# Patient Record
Sex: Male | Born: 1950 | Race: White | Hispanic: No | Marital: Married
Health system: Southern US, Community
[De-identification: ages and names within clinical notes are randomized; demographics above are authoritative.]

---

## 2010-11-24 HISTORY — PX: REPLACEMENT TOTAL KNEE: SUR1224

## 2021-01-22 DIAGNOSIS — C4362 Malignant melanoma of left upper limb, including shoulder: Secondary | ICD-10-CM | POA: Insufficient documentation

## 2021-01-25 DIAGNOSIS — E785 Hyperlipidemia, unspecified: Secondary | ICD-10-CM | POA: Insufficient documentation

## 2021-02-26 ENCOUNTER — Telehealth: Payer: Self-pay | Admitting: Oncology

## 2021-02-26 NOTE — Telephone Encounter (Signed)
A new patient appt has been scheduled for [name] to see Dr. Benay Spice on 4/12 at 3pm. Pt and his wife have made aware they are to go the [place] location. Aware to arrive 20 minutes early.

## 2021-03-04 ENCOUNTER — Encounter: Payer: Self-pay | Admitting: *Deleted

## 2021-03-04 ENCOUNTER — Telehealth: Payer: Self-pay | Admitting: *Deleted

## 2021-03-04 DIAGNOSIS — I10 Essential (primary) hypertension: Secondary | ICD-10-CM | POA: Insufficient documentation

## 2021-03-04 NOTE — Telephone Encounter (Signed)
Called patient and spoke w/him and wife. Updated his allergies, med list, pharmacy and problem list. Explained office location and to go to level 3 to park and office will be on that level. W/C is inside the door for use. His ambulation is limited due to "bone on bone" in right hip. Ortho (Dr. Mardelle Matte) defers timing of hip replacement to oncology. His PCP is in [place], a Dr. Caprice Renshaw. Patient is retired from [employer]. They are currently staying with their son, Steven Morrow in [place]. Their ususal routine is to go to [place] during the summer months. Wife will bring CD with recent CT scan to visit.

## 2021-03-05 ENCOUNTER — Ambulatory Visit
Admission: RE | Admit: 2021-03-05 | Discharge: 2021-03-05 | Disposition: A | Discharge status: Home / Self Care | Payer: Self-pay | Source: Ambulatory Visit | Attending: Oncology | Admitting: Oncology

## 2021-03-05 ENCOUNTER — Inpatient Hospital Stay: Payer: 59 | Attending: Oncology | Admitting: Oncology

## 2021-03-05 ENCOUNTER — Other Ambulatory Visit: Payer: Self-pay

## 2021-03-05 VITALS — BP 160/90 | HR 83 | Temp 98.0°F | Resp 18 | Ht 66.0 in | Wt 196.8 lb

## 2021-03-05 DIAGNOSIS — M1611 Unilateral primary osteoarthritis, right hip: Secondary | ICD-10-CM | POA: Insufficient documentation

## 2021-03-05 DIAGNOSIS — E785 Hyperlipidemia, unspecified: Secondary | ICD-10-CM | POA: Insufficient documentation

## 2021-03-05 DIAGNOSIS — C4362 Malignant melanoma of left upper limb, including shoulder: Secondary | ICD-10-CM | POA: Insufficient documentation

## 2021-03-05 DIAGNOSIS — Z85828 Personal history of other malignant neoplasm of skin: Secondary | ICD-10-CM | POA: Insufficient documentation

## 2021-03-05 DIAGNOSIS — I10 Essential (primary) hypertension: Secondary | ICD-10-CM | POA: Insufficient documentation

## 2021-03-05 DIAGNOSIS — Z8619 Personal history of other infectious and parasitic diseases: Secondary | ICD-10-CM | POA: Insufficient documentation

## 2021-03-05 NOTE — Progress Notes (Signed)
[facility] New Patient Consult   Requesting [id number] Lorren Splawn 70 y.o.  Jan 17, 1951    Reason for Consult: Melanoma   HPI: Mr. Honor Junes reports trauma to the left forearm approximately 3 months ago.  The area continued to bleed and did not heal with a "blister "formation.  He saw his dermatologist and a biopsy was obtained.  This confirmed a 2.9 mm Breslow depth malignant melanoma with ulceration and a mitotic rate of 2.  No evidence of satellite or in-transit metastases.  The tumor was staged as a pT3b,cN0cN0 melanoma. He was referred to Dr. Carlton Adam and was taken to a radical excision and sentinel lymph node mapping/biopsy on 01/31/2021.  A melanoma, lentigo maligna growth pattern was completely excised.  The Breslow depth returned at 4.24 mm, Clark level V, 6 mitoses per millimeter squared, ulceration was absent.  A total of 6 sentinel lymph nodes were obtained.  1 contained a 5 mm metastasis without extracapsular extension.  The pathologic stage was pT4apN1a.  A full-thickness skin graft from the left axilla was applied to the excision site.  CTs of the chest, abdomen, and pelvis on 01/31/2021 revealed no evidence of metastatic disease.  He is referred to consider the indication for adjuvant systemic therapy.  Past medical history:  1.  Hypertension 2.  Hyperlipidemia 3.  History of basal cell carcinomas at the scalp and right upper back 4.  Osteoarthritis of the right hip followed by Dr. Mardelle Matte 5.  History of hepatitis B  Past surgical history: 1.  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 Codeine Rash  . Glucosamine Forte [Nutritional Supplements] Rash  . Penicillins Rash    Family history: His father had colon cancer" skin "cancer  Social History:   He lives in [place], but temporarily is living with his son in [place].  He lives in [place] during the winter.  He  is retired from a Engineer, production occupation.  He does not use cigarettes.  He has 2 liquor drinks per day.  No transfusion history.  He had hepatitis in 1969 after eating clams in [place].  No risk factor for HIV.  He has received the COVID-19 vaccine.  ROS:   Positives include: Right hip pain, acute diarrhea 02/11/2021 with syncope and a right forehead laceration requiring sutures-emergency room evaluation, rash after taking glucosamine  A complete ROS was otherwise negative.  Physical Exam:  Blood pressure (!) 160/90, pulse 83, temperature 98 F (36.7 C), temperature source Tympanic, resp. rate 18, height '5\' 6"'  (1.676 m), weight 196 lb 12.8 oz (89.3 kg), SpO2 100 %.  HEENT: Neck without mass Lungs: Clear bilaterally Cardiac: Regular rate and rhythm Abdomen: No hepatosplenomegaly, no mass, nontender GU: Testes without mass Vascular: No leg edema Lymph nodes: No cervical, supraclavicular, axillary, or inguinal nodes Neurologic: Alert and oriented, the motor exam appears intact in the upper and lower extremities bilaterally Skin: Multiple benign-appearing moles over the trunk.  Healing skin graft site at the left lower arm, healed incision in the left axilla Musculoskeletal: No spine tenderness    Imaging: As per HPI   Assessment/Plan:   1. Stage IIIC melanoma (pT4apN1a), status post a radical excision of the left forearm melanoma, left axillary sentinel lymph node mapping/biopsy, and full thickness skin graft at the resection site 01/31/2021  Biopsy of left arm melanoma 12/28/2020-clinical stage IIb (cT3bcN0cM0), ulceration present, mitotic rate of 2/mm2  CTs  chest, abdomen, and pelvis 01/31/2021-no evidence of metastatic disease 2. History of hepatitis B 3. Hypertension 4. Right hip osteoarthritis 5.   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 Honor Junes has been diagnosed with malignant melanoma of the left forearm.  He underwent a radical excision,  sentinel lymph node mapping/biopsy, and full-thickness skin graft for a stage IIIc tumor.  We reviewed the pathologic staging, prognosis, and adjuvant treatment options.  He has a significant chance of developing recurrent melanoma over the next few years.  I recommend adjuvant systemic therapy.  We discussed immunotherapy and treatment with BRAF/MEK inhibitors.  We do not have a result for BRAF mutation testing in his tumor.  We will request BRAF mutation testing.  Mr. Honor Junes will return for an office visit and further discussion in 2 weeks.  He will be out of town next week.  We will discuss options based on the BRAF result.  He is symptomatic with severe pain at the right hip related osteoarthritis.  He plans to proceed with a right hip replacement by Dr. Mardelle Matte.  He should be able to undergo the hip replacement while receiving adjuvant systemic therapy.  Betsy Coder, MD  03/05/2021, 5:31 PM

## 2021-03-06 ENCOUNTER — Telehealth: Payer: Self-pay | Admitting: *Deleted

## 2021-03-06 NOTE — Telephone Encounter (Signed)
Per Dr. Benay Spice: Email to [facility] requesting BRAF testing on surgical cae #[id number] dated 01/31/21 from [facility]. Phone [phone]. Left VM for patient to provide copy of the dermatologist biopsy on 12/28/20 if they have it or contact office with name and phone of dermatologist doing the procedure.

## 2021-03-11 ENCOUNTER — Inpatient Hospital Stay
Admission: RE | Admit: 2021-03-11 | Discharge: 2021-03-11 | Disposition: A | Discharge status: Home / Self Care | Payer: Self-pay | Source: Ambulatory Visit | Attending: Oncology | Admitting: Oncology

## 2021-03-11 ENCOUNTER — Other Ambulatory Visit (HOSPITAL_COMMUNITY): Payer: Self-pay | Admitting: Oncology

## 2021-03-11 DIAGNOSIS — C801 Malignant (primary) neoplasm, unspecified: Secondary | ICD-10-CM

## 2021-03-20 ENCOUNTER — Other Ambulatory Visit: Payer: Self-pay

## 2021-03-20 ENCOUNTER — Encounter: Payer: Self-pay | Admitting: Oncology

## 2021-03-20 ENCOUNTER — Inpatient Hospital Stay: Payer: 59 | Admitting: Oncology

## 2021-03-20 ENCOUNTER — Inpatient Hospital Stay: Payer: 59

## 2021-03-20 ENCOUNTER — Telehealth: Payer: Self-pay

## 2021-03-20 VITALS — BP 173/71 | HR 67 | Temp 97.7°F | Resp 16 | Wt 193.8 lb

## 2021-03-20 DIAGNOSIS — Z8619 Personal history of other infectious and parasitic diseases: Secondary | ICD-10-CM | POA: Diagnosis not present

## 2021-03-20 DIAGNOSIS — C4362 Malignant melanoma of left upper limb, including shoulder: Secondary | ICD-10-CM

## 2021-03-20 DIAGNOSIS — I10 Essential (primary) hypertension: Secondary | ICD-10-CM | POA: Diagnosis not present

## 2021-03-20 DIAGNOSIS — Z85828 Personal history of other malignant neoplasm of skin: Secondary | ICD-10-CM | POA: Diagnosis not present

## 2021-03-20 DIAGNOSIS — E785 Hyperlipidemia, unspecified: Secondary | ICD-10-CM | POA: Diagnosis not present

## 2021-03-20 DIAGNOSIS — M1611 Unilateral primary osteoarthritis, right hip: Secondary | ICD-10-CM | POA: Diagnosis not present

## 2021-03-20 LAB — CMP (CANCER CENTER ONLY)
ALT: 26 U/L (ref 0–44)
AST: 23 U/L (ref 15–41)
Albumin: 4.5 g/dL (ref 3.5–5.0)
Alkaline Phosphatase: 86 U/L (ref 38–126)
Anion gap: 9 (ref 5–15)
BUN: 20 mg/dL (ref 8–23)
CO2: 26 mmol/L (ref 22–32)
Calcium: 9.8 mg/dL (ref 8.9–10.3)
Chloride: 105 mmol/L (ref 98–111)
Creatinine: 1.1 mg/dL (ref 0.61–1.24)
GFR, Estimated: >60 mL/min (ref >60)
Glucose, Bld: 97 mg/dL (ref 70–99)
Potassium: 4.3 mmol/L (ref 3.5–5.1)
Sodium: 140 mmol/L (ref 135–145)
Total Bilirubin: 0.8 mg/dL (ref 0.3–1.2)
Total Protein: 7.2 g/dL (ref 6.5–8.1)

## 2021-03-20 LAB — CBC WITH DIFFERENTIAL (CANCER CENTER ONLY)
Abs Immature Granulocytes: 0.03 10*3/uL (ref 0.00–0.07)
Basophils Absolute: 0 10*3/uL (ref 0.0–0.1)
Basophils Relative: 1 %
Eosinophils Absolute: 0.1 10*3/uL (ref 0.0–0.5)
Eosinophils Relative: 2 %
HCT: 43.2 % (ref 39.0–52.0)
Hemoglobin: 14.8 g/dL (ref 13.0–17.0)
Immature Granulocytes: 1 %
Lymphocytes Relative: 23 %
Lymphs Abs: 1.4 10*3/uL (ref 0.7–4.0)
MCH: 33.6 pg (ref 26.0–34.0)
MCHC: 34.3 g/dL (ref 30.0–36.0)
MCV: 98.2 fL (ref 80.0–100.0)
Monocytes Absolute: 0.5 10*3/uL (ref 0.1–1.0)
Monocytes Relative: 9 %
Neutro Abs: 3.8 10*3/uL (ref 1.7–7.7)
Neutrophils Relative %: 64 %
Platelet Count: 208 10*3/uL (ref 150–400)
RBC: 4.4 MIL/uL (ref 4.22–5.81)
RDW: 12.9 % (ref 11.5–15.5)
WBC Count: 5.9 10*3/uL (ref 4.0–10.5)
nRBC: 0 % (ref 0.0–0.2)

## 2021-03-20 LAB — TSH: TSH: 0.68 u[IU]/mL (ref 0.350–4.500)

## 2021-03-20 NOTE — Progress Notes (Addendum)
  [facility] OFFICE PROGRESS NOTE   Diagnosis: Melanoma  INTERVAL HISTORY:   [name] returns as scheduled.  He continues to have pain at the right hip.  He plans to have hip replacement surgery as soon as possible with Dr. Mardelle Matte.  The left arm incision is healing.  No new complaint.  Objective:  Vital signs in last 24 hours:  Blood pressure (!) 173/71, pulse 67, temperature 97.7 F (36.5 C), temperature source Oral, resp. rate 16, weight 193 lb 12.8 oz (87.9 kg), SpO2 100 %.    Lymphatics: No cervical, supraclavicular, or axillary nodes Resp: Lungs clear bilaterally Cardio: Regular rate and rhythm GI: No hepatosplenomegaly Vascular: No leg edema Skin: Healed incision at the left forearm with a remaining scab.  Healed left axillary incision  Portacath/PICC-without erythema  Lab Results:  No results found for: WBC, HGB, HCT, MCV, PLT, NEUTROABS  CMP  No results found for: NA, K, CL, CO2, GLUCOSE, BUN, CREATININE, CALCIUM, PROT, ALBUMIN, AST, ALT, ALKPHOS, BILITOT, GFRNONAA, GFRAA  No results found for: CEA1  No results found for: INR  Imaging:  No results found.  Medications: I have reviewed the patient's current medications.   Assessment/Plan: 1. Stage IIIC melanoma (pT4apN1a), status post a radical excision of the left forearm melanoma, left axillary sentinel lymph node mapping/biopsy, and full thickness skin graft at the resection site 01/31/2021  Biopsy of left arm melanoma 12/28/2020-clinical stage IIb (cT3bcN0cM0), ulceration present, mitotic rate of 2/mm2  CTs chest, abdomen, and pelvis 01/31/2021-no evidence of metastatic disease  01/31/2021- melanoma reexcision: Breslow depth 4.24 mm, Clark level V, 6 mitoses per millimeter squared, ulceration absent,pT4a, 1/6 lymph nodes with a 5 mm deposit of metastatic melanoma,pN1a, BRAF mutation not detected 2. History of hepatitis B 3. Hypertension 4. Right hip osteoarthritis 5.   Left total knee  replacement August 2012     Disposition:  Betsy Coder, MD  03/20/2021  12:29 PM   [name] has been diagnosed with stage IIIc melanoma of the left forearm.  He has recovered from surgery.  We do not have results from BRAF testing.  I discussed adjuvant treatment options again today with Mr. O'Connell.  We discussed targeted therapy with a BRAF/MEK regimen versus immunotherapy.  We reviewed the expected follow-up schedule and toxicities with these regimens.  He prefers pembrolizumab given on a 3-week schedule with the possibility of switching to a 6-week schedule in the future.  We reviewed potential toxicities associated with pembrolizumab including the chance of allergic reaction, rash, and diarrhea.  We discussed other autoimmune toxicities.  He understands these toxicities could be long-lasting or permanent.  He will return to the lab for a baseline CBC, chemistry panel, and TSH today.  He will be scheduled for cycle 1 pembrolizumab on 03/27/2021.  He will return for an office visit and cycle 2 on 04/17/2021.

## 2021-03-20 NOTE — Progress Notes (Signed)
START ON PATHWAY REGIMEN - Melanoma and Other Skin Cancers     A cycle is every 21 days:     Pembrolizumab   **Always confirm dose/schedule in your pharmacy ordering system**  Patient Characteristics: Melanoma, Cutaneous/Unknown Primary, Postoperative without Neoadjuvant Therapy (Pathologic Staging), Any pT, pN+, BRAF V600 Wild Type / BRAF V600 Results Pending or Unknown Disease Classification: Melanoma Disease Subtype: Cutaneous BRAF V600 Mutation Status: Awaiting BRAF V600 Results Therapeutic Status: Postoperative without Neoadjuvant Therapy (Pathologic Staging) AJCC T Category: pT4a AJCC N Category: pN1a AJCC M Category: cM0 AJCC 8 Stage Grouping: IIIC Intent of Therapy: Curative Intent, Discussed with Patient

## 2021-03-20 NOTE — Telephone Encounter (Signed)
TC to Pt per Dr Benay Spice to inform Pt that B-RAF genetics test was negative. Spoke with Pt's wife and informed her test was negative and per Dr Benay Spice Immunotherapy is the way to go. Pt's wife verbalized understanding. No further problems or concerns noted.

## 2021-03-21 ENCOUNTER — Encounter: Payer: Self-pay | Admitting: Oncology

## 2021-03-24 ENCOUNTER — Other Ambulatory Visit: Payer: Self-pay | Admitting: Oncology

## 2021-03-27 ENCOUNTER — Other Ambulatory Visit: Payer: Self-pay

## 2021-03-27 ENCOUNTER — Inpatient Hospital Stay: Payer: Medicare (Managed Care) | Attending: Oncology

## 2021-03-27 ENCOUNTER — Inpatient Hospital Stay: Payer: Medicare (Managed Care)

## 2021-03-27 VITALS — BP 155/87 | HR 64 | Temp 98.3°F | Resp 18 | Ht 66.0 in | Wt 195.8 lb

## 2021-03-27 DIAGNOSIS — Z5112 Encounter for antineoplastic immunotherapy: Secondary | ICD-10-CM | POA: Insufficient documentation

## 2021-03-27 DIAGNOSIS — C4362 Malignant melanoma of left upper limb, including shoulder: Secondary | ICD-10-CM

## 2021-03-27 MED ORDER — PROCHLORPERAZINE MALEATE 10 MG PO TABS: 10.0000 mg | ORAL_TABLET | Freq: Four times a day (QID) | ORAL | 1 refills | Status: DC | PRN

## 2021-03-27 MED ORDER — SODIUM CHLORIDE 0.9 % IV SOLN
Freq: Once | INTRAVENOUS | Status: AC
  Filled 2021-03-27: qty 250

## 2021-03-27 MED ORDER — SODIUM CHLORIDE 0.9 % IV SOLN
200.0000 mg | Freq: Once | INTRAVENOUS | Status: AC
  Administered 2021-03-27: 200 mg via INTRAVENOUS
  Filled 2021-03-27: qty 8

## 2021-03-27 NOTE — Progress Notes (Signed)
Per Dr. Benay Spice: okay to use Labs from 03/20/21

## 2021-03-27 NOTE — Patient Instructions (Signed)
[facility]    Discharge Instructions:  Thank you for choosing [facility] to provide your oncology and hematology care.   If you have a lab appointment with the Cancer Center, please go directly to the Cancer Center and check in at the registration area.   Wear comfortable clothing and clothing appropriate for easy access to any Portacath or PICC line.   We strive to give you quality time with your provider. You may need to reschedule your appointment if you arrive late (15 or more minutes).  Arriving late affects you and other patients whose appointments are after yours.  Also, if you miss three or more appointments without notifying the office, you may be dismissed from the clinic at the provider's discretion.      For prescription refill requests, have your pharmacy contact our office and allow 72 hours for refills to be completed.    Today you received the following chemotherapy and/or immunotherapy agents Pembrolizumab (KEYTRUDA).   To help prevent nausea and vomiting after your treatment, we encourage you to take your nausea medication as directed.  BELOW ARE SYMPTOMS THAT SHOULD BE REPORTED IMMEDIATELY: . *FEVER GREATER THAN 100.4 F (38 C) OR HIGHER . *CHILLS OR SWEATING . *NAUSEA AND VOMITING THAT IS NOT CONTROLLED WITH YOUR NAUSEA MEDICATION . *UNUSUAL SHORTNESS OF BREATH . *UNUSUAL BRUISING OR BLEEDING . *URINARY PROBLEMS (pain or burning when urinating, or frequent urination) . *BOWEL PROBLEMS (unusual diarrhea, constipation, pain near the anus) . TENDERNESS IN MOUTH AND THROAT WITH OR WITHOUT PRESENCE OF ULCERS (sore throat, sores in mouth, or a toothache) . UNUSUAL RASH, SWELLING OR PAIN  . UNUSUAL VAGINAL DISCHARGE OR ITCHING   Items with * indicate a potential emergency and should be followed up as soon as possible or go to the Emergency Department if any problems should occur.  Please show the CHEMOTHERAPY ALERT CARD or  IMMUNOTHERAPY ALERT CARD at check-in to the Emergency Department and triage nurse.  Should you have questions after your visit or need to cancel or reschedule your appointment, please contact [facility]  Dept: [phone]  and follow the prompts.  Office hours are 8:00 a.m. to 4:30 p.m. Monday - Friday. Please note that voicemails left after 4:00 p.m. may not be returned until the following business day.  We are closed weekends and major holidays. You have access to a nurse at all times for urgent questions. Please call the main number to the clinic Dept: [phone] and follow the prompts.   For any non-urgent questions, you may also contact your provider using MyChart. We now offer e-Visits for anyone 18 and older to request care online for non-urgent symptoms. For details visit [email or web address].   Also download the MyChart app! Go to the app store, search "MyChart", open the app, select [facility], and log in with your MyChart username and password.  Due to Covid, a mask is required upon entering the hospital/clinic. If you do not have a mask, one will be given to you upon arrival. For doctor visits, patients may have 1 support person aged 18 or older with them. For treatment visits, patients cannot have anyone with them due to current Covid guidelines and our immunocompromised population.   Pembrolizumab injection What is this medicine? PEMBROLIZUMAB (pem broe liz ue mab) is a monoclonal antibody. It is used to treat certain types of cancer. This medicine may be used for other purposes; ask your health care provider   or pharmacist if you have questions. COMMON BRAND NAME(S): Keytruda What should I tell my health care provider before I take this medicine? They need to know if you have any of these conditions:  autoimmune diseases like Crohn's disease, ulcerative colitis, or lupus  have had or planning to have an allogeneic stem cell transplant (uses  someone else's stem cells)  history of organ transplant  history of chest radiation  nervous system problems like myasthenia gravis or Guillain-Barre syndrome  an unusual or allergic reaction to pembrolizumab, other medicines, foods, dyes, or preservatives  pregnant or trying to get pregnant  breast-feeding How should I use this medicine? This medicine is for infusion into a vein. It is given by a health care professional in a hospital or clinic setting. A special MedGuide will be given to you before each treatment. Be sure to read this information carefully each time. Talk to your pediatrician regarding the use of this medicine in children. While this drug may be prescribed for children as young as 6 months for selected conditions, precautions do apply. Overdosage: If you think you have taken too much of this medicine contact a poison control center or emergency room at once. NOTE: This medicine is only for you. Do not share this medicine with others. What if I miss a dose? It is important not to miss your dose. Call your doctor or health care professional if you are unable to keep an appointment. What may interact with this medicine? Interactions have not been studied. This list may not describe all possible interactions. Give your health care provider a list of all the medicines, herbs, non-prescription drugs, or dietary supplements you use. Also tell them if you smoke, drink alcohol, or use illegal drugs. Some items may interact with your medicine. What should I watch for while using this medicine? Your condition will be monitored carefully while you are receiving this medicine. You may need blood work done while you are taking this medicine. Do not become pregnant while taking this medicine or for 4 months after stopping it. Women should inform their doctor if they wish to become pregnant or think they might be pregnant. There is a potential for serious side effects to an unborn  child. Talk to your health care professional or pharmacist for more information. Do not breast-feed an infant while taking this medicine or for 4 months after the last dose. What side effects may I notice from receiving this medicine? Side effects that you should report to your doctor or health care professional as soon as possible:  allergic reactions like skin rash, itching or hives, swelling of the face, lips, or tongue  bloody or black, tarry  breathing problems  changes in vision  chest pain  chills  confusion  constipation  cough  diarrhea  dizziness or feeling faint or lightheaded  fast or irregular heartbeat  fever  flushing  joint pain  low blood counts - this medicine may decrease the number of white blood cells, red blood cells and platelets. You may be at increased risk for infections and bleeding.  muscle pain  muscle weakness  pain, tingling, numbness in the hands or feet  persistent headache  redness, blistering, peeling or loosening of the skin, including inside the mouth  signs and symptoms of high blood sugar such as dizziness; dry mouth; dry skin; fruity breath; nausea; stomach pain; increased hunger or thirst; increased urination  signs and symptoms of kidney injury like trouble passing urine or change in   the amount of urine  signs and symptoms of liver injury like dark urine, light-colored stools, loss of appetite, nausea, right upper belly pain, yellowing of the eyes or skin  sweating  swollen lymph nodes  weight loss Side effects that usually do not require medical attention (report to your doctor or health care professional if they continue or are bothersome):  decreased appetite  hair loss  tiredness This list may not describe all possible side effects. Call your doctor for medical advice about side effects. You may report side effects to FDA at 1-800-FDA-1088. Where should I keep my medicine? This drug is given in a hospital  or clinic and will not be stored at home. NOTE: This sheet is a summary. It may not cover all possible information. If you have questions about this medicine, talk to your doctor, pharmacist, or health care provider.  2021 Elsevier/Gold Standard (2019-10-12 21:44:53)  

## 2021-03-28 ENCOUNTER — Telehealth: Payer: Self-pay | Admitting: *Deleted

## 2021-03-28 NOTE — Telephone Encounter (Signed)
Patient called back to report no adverse effect from [facility]. Has developed some sinus congestion/sneezing and asking if OK to take Advil Sinus? Also asking for suggestion for OTC sleep aid. Informed him OK to take Advil Sinus. Can try OTC Benadryl, Tylenol PM or Melatonin. He has no desire to take Benadryl.

## 2021-03-28 NOTE — Telephone Encounter (Signed)
Attempted to reach patient re: status since 1st Keytruda on 5/04. Left VM requesting a return call.

## 2021-04-01 ENCOUNTER — Encounter: Payer: Self-pay | Admitting: Oncology

## 2021-04-01 ENCOUNTER — Other Ambulatory Visit: Payer: Self-pay | Admitting: Oncology

## 2021-04-03 ENCOUNTER — Encounter: Payer: Self-pay | Admitting: Oncology

## 2021-04-08 ENCOUNTER — Telehealth: Payer: Self-pay

## 2021-04-08 NOTE — Telephone Encounter (Signed)
Return call from Pt's wife stating Pt was having burning when he urinates. Spoke with Pt who stated that those symptoms have subsided once he increased his fluid intake. Informed Pt to continue fluids and if the symptoms should reoccur to give [place] a call. Pt verbalized understanding. No further problems or concerns noted.

## 2021-04-14 ENCOUNTER — Other Ambulatory Visit: Payer: Self-pay | Admitting: Oncology

## 2021-04-16 ENCOUNTER — Encounter: Payer: Self-pay | Admitting: Oncology

## 2021-04-17 ENCOUNTER — Inpatient Hospital Stay: Payer: Medicare (Managed Care) | Admitting: Oncology

## 2021-04-17 ENCOUNTER — Other Ambulatory Visit: Payer: Self-pay

## 2021-04-17 ENCOUNTER — Telehealth: Payer: Self-pay

## 2021-04-17 ENCOUNTER — Inpatient Hospital Stay: Payer: Medicare (Managed Care)

## 2021-04-17 VITALS — BP 136/81 | HR 72 | Temp 97.8°F | Resp 18 | Ht 66.0 in | Wt 192.0 lb

## 2021-04-17 DIAGNOSIS — Z5112 Encounter for antineoplastic immunotherapy: Secondary | ICD-10-CM | POA: Diagnosis not present

## 2021-04-17 DIAGNOSIS — C4362 Malignant melanoma of left upper limb, including shoulder: Secondary | ICD-10-CM

## 2021-04-17 MED ORDER — SODIUM CHLORIDE 0.9 % IV SOLN
Freq: Once | INTRAVENOUS | Status: AC
Start: 2021-04-17 — End: 2021-04-17
  Filled 2021-04-17: qty 250

## 2021-04-17 MED ORDER — SODIUM CHLORIDE 0.9 % IV SOLN
200.0000 mg | Freq: Once | INTRAVENOUS | Status: AC
  Administered 2021-04-17: 200 mg via INTRAVENOUS
  Filled 2021-04-17: qty 8

## 2021-04-17 NOTE — Telephone Encounter (Signed)
Pt had lab work done from outside lab. Scanned in today. ok to treat  With these results per Dr Benay Spice.

## 2021-04-17 NOTE — Progress Notes (Signed)
  [facility] OFFICE PROGRESS NOTE   Diagnosis: Melanoma  INTERVAL HISTORY:   [name] returns as scheduled.  He completed a first treatment with pembrolizumab on 03/27/2021.  No rash or diarrhea.  He reports transient burning with urination.  This has resolved.  He continues to have pain at the right hip and leg.  He is scheduled for hip replacement surgery on 05/02/2021.  He has noted a "bony "prominence in the left axilla.  Objective:  Vital signs in last 24 hours:  Blood pressure 136/81, pulse 72, temperature 97.8 F (36.6 C), resp. rate 18, height $RemoveBe'5\' 6"'[id number]$  (1.676 m), weight 192 lb (87.1 kg), SpO2 98 %.    Lymphatics: No axillary nodes, postoperative changes in the left axilla.  No mass. Resp: Lungs clear bilaterally Cardio: Regular rate and rhythm GI: No hepatosplenomegaly, no mass, nontender Vascular: No leg edema  Skin: Healed incision at the left forearm    Lab Results:  Lab Results  Component Value Date   WBC 5.9 03/20/2021   HGB 14.8 03/20/2021   HCT 43.2 03/20/2021   MCV 98.2 03/20/2021   PLT 208 03/20/2021   NEUTROABS 3.8 03/20/2021    CMP  Lab Results  Component Value Date   NA 140 03/20/2021   K 4.3 03/20/2021   CL 105 03/20/2021   CO2 26 03/20/2021   GLUCOSE 97 03/20/2021   BUN 20 03/20/2021   CREATININE 1.10 03/20/2021   CALCIUM 9.8 03/20/2021   PROT 7.2 03/20/2021   ALBUMIN 4.5 03/20/2021   AST 23 03/20/2021   ALT 26 03/20/2021   ALKPHOS 86 03/20/2021   BILITOT 0.8 03/20/2021   GFRNONAA >60 03/20/2021    Medications: I have reviewed the patient's current medications.   Assessment/Plan: 1. Stage IIIC melanoma (pT4apN1a), status post a radical excision of the left forearm melanoma, left axillary sentinel lymph node mapping/biopsy, and full thickness skin graft at the resection site 01/31/2021  Biopsy of left arm melanoma 12/28/2020-clinical stage IIb (cT3bcN0cM0), ulceration present, mitotic rate of 2/mm2  CTs chest, abdomen,  and pelvis 01/31/2021-no evidence of metastatic disease  01/31/2021- melanoma reexcision: Breslow depth 4.24 mm, Clark level V, 6 mitoses per millimeter squared, ulceration absent,pT4a, 1/6 lymph nodes with a 5 mm deposit of metastatic melanoma,pN1a, BRAF mutation not detected  Cycle 1 pembrolizumab 03/27/2021 2. History of hepatitis B 3. Hypertension 4. Right hip osteoarthritis 5.   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 appears stable.  He tolerated the first treatment with pembrolizumab well.  He will complete cycle 2 today.  He had labs at [facility] on 04/15/2021.  The chemistry panel was unremarkable.  He will return for an office visit in the next cycle of pembrolizumab in 3 weeks.  Betsy Coder, MD  04/17/2021  12:04 PM

## 2021-04-17 NOTE — Patient Instructions (Signed)
[facility]  Discharge Instructions: Thank you for choosing [facility] to provide your oncology and hematology care.   If you have a lab appointment with the [facility], please go directly to the [facility] and check in at the registration area.   Wear comfortable clothing and clothing appropriate for easy access to any Portacath or PICC line.   We strive to give you quality time with your provider. You may need to reschedule your appointment if you arrive late (15 or more minutes).  Arriving late affects you and other patients whose appointments are after yours.  Also, if you miss three or more appointments without notifying the office, you may be dismissed from the clinic at the provider's discretion.      For prescription refill requests, have your pharmacy contact our office and allow 72 hours for refills to be completed.    Today you received the following chemotherapy and/or immunotherapy agents Keytruda    To help prevent nausea and vomiting after your treatment, we encourage you to take your nausea medication as directed.  BELOW ARE SYMPTOMS THAT SHOULD BE REPORTED IMMEDIATELY: . *FEVER GREATER THAN 100.4 F (38 C) OR HIGHER . *CHILLS OR SWEATING . *NAUSEA AND VOMITING THAT IS NOT CONTROLLED WITH YOUR NAUSEA MEDICATION . *UNUSUAL SHORTNESS OF BREATH . *UNUSUAL BRUISING OR BLEEDING . *URINARY PROBLEMS (pain or burning when urinating, or frequent urination) . *BOWEL PROBLEMS (unusual diarrhea, constipation, pain near the anus) . TENDERNESS IN MOUTH AND THROAT WITH OR WITHOUT PRESENCE OF ULCERS (sore throat, sores in mouth, or a toothache) . UNUSUAL RASH, SWELLING OR PAIN  . UNUSUAL VAGINAL DISCHARGE OR ITCHING   Items with * indicate a potential emergency and should be followed up as soon as possible or go to the Emergency Department if any problems should occur.  Please show the CHEMOTHERAPY ALERT CARD or IMMUNOTHERAPY ALERT CARD  at check-in to the Emergency Department and triage nurse.  Should you have questions after your visit or need to cancel or reschedule your appointment, please contact [facility]  Dept: [phone]  and follow the prompts.  Office hours are 8:00 a.m. to 4:30 p.m. Monday - Friday. Please note that voicemails left after 4:00 p.m. may not be returned until the following business day.  We are closed weekends and major holidays. You have access to a nurse at all times for urgent questions. Please call the main number to the clinic Dept: [phone] and follow the prompts.   For any non-urgent questions, you may also contact your provider using MyChart. We now offer e-Visits for anyone 72 and older to request care online for non-urgent symptoms. For details visit [email or web address].   Also download the MyChart app! Go to the app store, search "MyChart", open the app, select [facility], and log in with your MyChart username and password.  Due to Covid, a mask is required upon entering the hospital/clinic. If you do not have a mask, one will be given to you upon arrival. For doctor visits, patients may have 1 support person aged 39 or older with them. For treatment visits, patients cannot have anyone with them due to current Covid guidelines and our immunocompromised population.   Pembrolizumab injection What is this medicine? PEMBROLIZUMAB (pem broe liz ue mab) is a monoclonal antibody. It is used to treat certain types of cancer. This medicine may be used for other purposes; ask your health care provider or pharmacist if  you have questions. COMMON BRAND NAME(S): Keytruda What should I tell my health care provider before I take this medicine? They need to know if you have any of these conditions:  autoimmune diseases like Crohn's disease, ulcerative colitis, or lupus  have had or planning to have an allogeneic stem cell transplant (uses someone else's stem  cells)  history of organ transplant  history of chest radiation  nervous system problems like myasthenia gravis or Guillain-Barre syndrome  an unusual or allergic reaction to pembrolizumab, other medicines, foods, dyes, or preservatives  pregnant or trying to get pregnant  breast-feeding How should I use this medicine? This medicine is for infusion into a vein. It is given by a health care professional in a hospital or clinic setting. A special MedGuide will be given to you before each treatment. Be sure to read this information carefully each time. Talk to your pediatrician regarding the use of this medicine in children. While this drug may be prescribed for children as young as 6 months for selected conditions, precautions do apply. Overdosage: If you think you have taken too much of this medicine contact a poison control center or emergency room at once. NOTE: This medicine is only for you. Do not share this medicine with others. What if I miss a dose? It is important not to miss your dose. Call your doctor or health care professional if you are unable to keep an appointment. What may interact with this medicine? Interactions have not been studied. This list may not describe all possible interactions. Give your health care provider a list of all the medicines, herbs, non-prescription drugs, or dietary supplements you use. Also tell them if you smoke, drink alcohol, or use illegal drugs. Some items may interact with your medicine. What should I watch for while using this medicine? Your condition will be monitored carefully while you are receiving this medicine. You may need blood work done while you are taking this medicine. Do not become pregnant while taking this medicine or for 4 months after stopping it. Women should inform their doctor if they wish to become pregnant or think they might be pregnant. There is a potential for serious side effects to an unborn child. Talk to your  health care professional or pharmacist for more information. Do not breast-feed an infant while taking this medicine or for 4 months after the last dose. What side effects may I notice from receiving this medicine? Side effects that you should report to your doctor or health care professional as soon as possible:  allergic reactions like skin rash, itching or hives, swelling of the face, lips, or tongue  bloody or black, tarry  breathing problems  changes in vision  chest pain  chills  confusion  constipation  cough  diarrhea  dizziness or feeling faint or lightheaded  fast or irregular heartbeat  fever  flushing  joint pain  low blood counts - this medicine may decrease the number of white blood cells, red blood cells and platelets. You may be at increased risk for infections and bleeding.  muscle pain  muscle weakness  pain, tingling, numbness in the hands or feet  persistent headache  redness, blistering, peeling or loosening of the skin, including inside the mouth  signs and symptoms of high blood sugar such as dizziness; dry mouth; dry skin; fruity breath; nausea; stomach pain; increased hunger or thirst; increased urination  signs and symptoms of kidney injury like trouble passing urine or change in the amount of  urine  signs and symptoms of liver injury like dark urine, light-colored stools, loss of appetite, nausea, right upper belly pain, yellowing of the eyes or skin  sweating  swollen lymph nodes  weight loss Side effects that usually do not require medical attention (report to your doctor or health care professional if they continue or are bothersome):  decreased appetite  hair loss  tiredness This list may not describe all possible side effects. Call your doctor for medical advice about side effects. You may report side effects to FDA at 1-800-FDA-1088. Where should I keep my medicine? This drug is given in a hospital or clinic and will  not be stored at home. NOTE: This sheet is a summary. It may not cover all possible information. If you have questions about this medicine, talk to your doctor, pharmacist, or health care provider.  2021 Elsevier/Gold Standard (2019-10-12 21:44:53)

## 2021-04-23 ENCOUNTER — Encounter: Payer: Self-pay | Admitting: Oncology

## 2021-04-27 ENCOUNTER — Encounter: Payer: Self-pay | Admitting: Oncology

## 2021-04-29 ENCOUNTER — Encounter: Payer: Self-pay | Admitting: Oncology

## 2021-05-05 ENCOUNTER — Other Ambulatory Visit: Payer: Self-pay | Admitting: Oncology

## 2021-05-08 ENCOUNTER — Inpatient Hospital Stay: Payer: Medicare (Managed Care) | Attending: Oncology

## 2021-05-08 ENCOUNTER — Inpatient Hospital Stay (HOSPITAL_BASED_OUTPATIENT_CLINIC_OR_DEPARTMENT_OTHER): Payer: Medicare (Managed Care) | Admitting: Oncology

## 2021-05-08 ENCOUNTER — Inpatient Hospital Stay: Payer: Medicare (Managed Care)

## 2021-05-08 ENCOUNTER — Other Ambulatory Visit: Payer: Self-pay

## 2021-05-08 ENCOUNTER — Encounter: Payer: Self-pay | Admitting: Oncology

## 2021-05-08 VITALS — BP 123/70 | HR 84 | Temp 97.8°F | Resp 18 | Ht 66.0 in | Wt 192.0 lb

## 2021-05-08 DIAGNOSIS — C4362 Malignant melanoma of left upper limb, including shoulder: Secondary | ICD-10-CM | POA: Insufficient documentation

## 2021-05-08 DIAGNOSIS — Z96641 Presence of right artificial hip joint: Secondary | ICD-10-CM | POA: Insufficient documentation

## 2021-05-08 DIAGNOSIS — Z79899 Other long term (current) drug therapy: Secondary | ICD-10-CM | POA: Diagnosis not present

## 2021-05-08 DIAGNOSIS — I10 Essential (primary) hypertension: Secondary | ICD-10-CM | POA: Diagnosis not present

## 2021-05-08 DIAGNOSIS — Z5112 Encounter for antineoplastic immunotherapy: Secondary | ICD-10-CM | POA: Diagnosis present

## 2021-05-08 LAB — CMP (CANCER CENTER ONLY)
ALT: 21 U/L (ref 0–44)
AST: 18 U/L (ref 15–41)
Albumin: 4.1 g/dL (ref 3.5–5.0)
Alkaline Phosphatase: 80 U/L (ref 38–126)
Anion gap: 11 (ref 5–15)
BUN: 31 mg/dL — ABNORMAL HIGH (ref 8–23)
CO2: 24 mmol/L (ref 22–32)
Calcium: 9.1 mg/dL (ref 8.9–10.3)
Chloride: 105 mmol/L (ref 98–111)
Creatinine: 1.08 mg/dL (ref 0.61–1.24)
GFR, Estimated: >60 mL/min (ref >60)
Glucose, Bld: 89 mg/dL (ref 70–99)
Potassium: 3.9 mmol/L (ref 3.5–5.1)
Sodium: 140 mmol/L (ref 135–145)
Total Bilirubin: 0.8 mg/dL (ref 0.3–1.2)
Total Protein: 7.2 g/dL (ref 6.5–8.1)

## 2021-05-08 MED ORDER — SODIUM CHLORIDE 0.9 % IV SOLN
Freq: Once | INTRAVENOUS | Status: AC
  Filled 2021-05-08: qty 250

## 2021-05-08 MED ORDER — SODIUM CHLORIDE 0.9 % IV SOLN
200.0000 mg | Freq: Once | INTRAVENOUS | Status: AC
  Administered 2021-05-08: 200 mg via INTRAVENOUS
  Filled 2021-05-08: qty 8

## 2021-05-08 NOTE — Progress Notes (Signed)
  [facility] OFFICE VISIT PROGRESS NOTE  I connected with Steven Morrow on 05/08/21 at 11:40 AM EDT by video enabled telemedicine visit and verified that I am speaking with the correct person using two identifiers.   I discussed the limitations, risks, security and privacy concerns of performing an evaluation and management service by telemedicine and the availability of in-person appointments. I also discussed with the patient that there may be a patient responsible charge related to this service. The patient expressed understanding and agreed to proceed.  Other persons participating in the visit and their role in the encounter: Wife  Patient's location: Office Provider's location: Home    Diagnosis: Melanoma  INTERVAL HISTORY:  [name] completed another cycle of pembrolizumab on 04/17/2021.  He reports tolerating the treatment well.  No rash or diarrhea.  He underwent right hip replacement surgery on 05/02/2021.  He had pain following surgery.  He is no longer taking narcotic analgesics.  He is participating in physical therapy.  He continues to have discomfort at the surgical site.  Objective:  Vital signs in last 24 hours:  Blood pressure 123/70, pulse 84, temperature 97.8 F (36.6 C), temperature source Oral, resp. rate 18, height _0  (1.676 m), weight 192 lb (87.1 kg), SpO2 100 %.     Lab Results:  Lab Results  Component Value Date   WBC 5.9 03/20/2021   HGB 14.8 03/20/2021   HCT 43.2 03/20/2021   MCV 98.2 03/20/2021   PLT 208 03/20/2021   NEUTROABS 3.8 03/20/2021    Medications: I have reviewed the patient's current medications.  Assessment/Plan: Stage IIIC melanoma (pT4apN1a), status post a radical excision of the left forearm melanoma, left axillary sentinel lymph node mapping/biopsy, and full thickness skin graft at the resection site 01/31/2021 Biopsy of left arm melanoma 12/28/2020-clinical stage IIb  ([id number]), ulceration present, mitotic rate of 2/mm2 CTs chest, abdomen, and pelvis 01/31/2021-no evidence of metastatic disease 01/31/2021- melanoma reexcision: Breslow depth 4.24 mm, Clark level V, 6 mitoses per millimeter squared, ulceration absent,pT4a, 1/6 lymph nodes with a 5 mm deposit of metastatic melanoma,pN1a, BRAF mutation not detected Cycle 1 pembrolizumab 03/27/2021 Cycle 2 pembrolizumab 04/17/2021 Cycle 3 pembrolizumab 05/08/2021 History of hepatitis B Hypertension Right hip osteoarthritis 5.   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   Right hip replacement surgery 05/02/2021   Disposition: [name] appears to be tolerating the pembrolizumab well.  He will complete another cycle today.  I reviewed today's chemistry panel.  He continues physical therapy following hip replacement surgery.  [name] will return for an office visit and pembrolizumab in 3 weeks.   I discussed the assessment and treatment plan with the patient. The patient was provided an opportunity to ask questions and all were answered. The patient agreed with the plan and demonstrated an understanding of the instructions.   The patient was advised to call back or seek an in-person evaluation if the symptoms worsen or if the condition fails to improve as anticipated.  I provided 20 minutes chart review, video, and documentation time during this encounter, and > 50% was spent counseling as documented under my assessment & plan.  Betsy Coder ANP/GNP-BC   05/08/2021 11:43 AM

## 2021-05-08 NOTE — Patient Instructions (Signed)
[facility]   Discharge Instructions: Thank you for choosing [facility] to provide your oncology and hematology care.   If you have a lab appointment with the Cancer Center, please go directly to the Cancer Center and check in at the registration area.   Wear comfortable clothing and clothing appropriate for easy access to any Portacath or PICC line.   We strive to give you quality time with your provider. You may need to reschedule your appointment if you arrive late (15 or more minutes).  Arriving late affects you and other patients whose appointments are after yours.  Also, if you miss three or more appointments without notifying the office, you may be dismissed from the clinic at the provider's discretion.      For prescription refill requests, have your pharmacy contact our office and allow 72 hours for refills to be completed.    Today you received the following chemotherapy and/or immunotherapy agents Pembrolizumab (KEYTRUDA).      To help prevent nausea and vomiting after your treatment, we encourage you to take your nausea medication as directed.  BELOW ARE SYMPTOMS THAT SHOULD BE REPORTED IMMEDIATELY: *FEVER GREATER THAN 100.4 F (38 C) OR HIGHER *CHILLS OR SWEATING *NAUSEA AND VOMITING THAT IS NOT CONTROLLED WITH YOUR NAUSEA MEDICATION *UNUSUAL SHORTNESS OF BREATH *UNUSUAL BRUISING OR BLEEDING *URINARY PROBLEMS (pain or burning when urinating, or frequent urination) *BOWEL PROBLEMS (unusual diarrhea, constipation, pain near the anus) TENDERNESS IN MOUTH AND THROAT WITH OR WITHOUT PRESENCE OF ULCERS (sore throat, sores in mouth, or a toothache) UNUSUAL RASH, SWELLING OR PAIN  UNUSUAL VAGINAL DISCHARGE OR ITCHING   Items with * indicate a potential emergency and should be followed up as soon as possible or go to the Emergency Department if any problems should occur.  Please show the CHEMOTHERAPY ALERT CARD or IMMUNOTHERAPY ALERT CARD  at check-in to the Emergency Department and triage nurse.  Should you have questions after your visit or need to cancel or reschedule your appointment, please contact [facility]  Dept: [phone]  and follow the prompts.  Office hours are 8:00 a.m. to 4:30 p.m. Monday - Friday. Please note that voicemails left after 4:00 p.m. may not be returned until the following business day.  We are closed weekends and major holidays. You have access to a nurse at all times for urgent questions. Please call the main number to the clinic Dept: [phone] and follow the prompts.   For any non-urgent questions, you may also contact your provider using MyChart. We now offer e-Visits for anyone 18 and older to request care online for non-urgent symptoms. For details visit [email or web address].   Also download the MyChart app! Go to the app store, search "MyChart", open the app, select [facility], and log in with your MyChart username and password.  Due to Covid, a mask is required upon entering the hospital/clinic. If you do not have a mask, one will be given to you upon arrival. For doctor visits, patients may have 1 support person aged 18 or older with them. For treatment visits, patients cannot have anyone with them due to current Covid guidelines and our immunocompromised population.   Pembrolizumab injection What is this medication? PEMBROLIZUMAB (pem broe liz ue mab) is a monoclonal antibody. It is used totreat certain types of cancer. This medicine may be used for other purposes; ask your health care provider orpharmacist if you have questions. COMMON BRAND NAME(S): Keytruda What   should I tell my care team before I take this medication? They need to know if you have any of these conditions: autoimmune diseases like Crohn's disease, ulcerative colitis, or lupus have had or planning to have an allogeneic stem cell transplant (uses someone else's stem cells) history of organ  transplant history of chest radiation nervous system problems like myasthenia gravis or Guillain-Barre syndrome an unusual or allergic reaction to pembrolizumab, other medicines, foods, dyes, or preservatives pregnant or trying to get pregnant breast-feeding How should I use this medication? This medicine is for infusion into a vein. It is given by a health careprofessional in a hospital or clinic setting. A special MedGuide will be given to you before each treatment. Be sure to readthis information carefully each time. Talk to your pediatrician regarding the use of this medicine in children. While this drug may be prescribed for children as young as 6 months for selectedconditions, precautions do apply. Overdosage: If you think you have taken too much of this medicine contact apoison control center or emergency room at once. NOTE: This medicine is only for you. Do not share this medicine with others. What if I miss a dose? It is important not to miss your dose. Call your doctor or health careprofessional if you are unable to keep an appointment. What may interact with this medication? Interactions have not been studied. This list may not describe all possible interactions. Give your health care provider a list of all the medicines, herbs, non-prescription drugs, or dietary supplements you use. Also tell them if you smoke, drink alcohol, or use illegaldrugs. Some items may interact with your medicine. What should I watch for while using this medication? Your condition will be monitored carefully while you are receiving thismedicine. You may need blood work done while you are taking this medicine. Do not become pregnant while taking this medicine or for 4 months after stopping it. Women should inform their doctor if they wish to become pregnant or think they might be pregnant. There is a potential for serious side effects to an unborn child. Talk to your health care professional or pharmacist for  more information. Do not breast-feed an infant while taking this medicine orfor 4 months after the last dose. What side effects may I notice from receiving this medication? Side effects that you should report to your doctor or health care professionalas soon as possible: allergic reactions like skin rash, itching or hives, swelling of the face, lips, or tongue bloody or black, tarry breathing problems changes in vision chest pain chills confusion constipation cough diarrhea dizziness or feeling faint or lightheaded fast or irregular heartbeat fever flushing joint pain low blood counts - this medicine may decrease the number of white blood cells, red blood cells and platelets. You may be at increased risk for infections and bleeding. muscle pain muscle weakness pain, tingling, numbness in the hands or feet persistent headache redness, blistering, peeling or loosening of the skin, including inside the mouth signs and symptoms of high blood sugar such as dizziness; dry mouth; dry skin; fruity breath; nausea; stomach pain; increased hunger or thirst; increased urination signs and symptoms of kidney injury like trouble passing urine or change in the amount of urine signs and symptoms of liver injury like dark urine, light-colored stools, loss of appetite, nausea, right upper belly pain, yellowing of the eyes or skin sweating swollen lymph nodes weight loss Side effects that usually do not require medical attention (report to yourdoctor or health care professional if   they continue or are bothersome): decreased appetite hair loss tiredness This list may not describe all possible side effects. Call your doctor for medical advice about side effects. You may report side effects to FDA at1-800-FDA-1088. Where should I keep my medication? This drug is given in a hospital or clinic and will not be stored at home. NOTE: This sheet is a summary. It may not cover all possible information. If you  have questions about this medicine, talk to your doctor, pharmacist, orhealth care provider.  2022 Elsevier/Gold Standard (2019-10-12 21:44:53)  

## 2021-05-27 ENCOUNTER — Other Ambulatory Visit: Payer: Self-pay | Admitting: Oncology

## 2021-05-29 ENCOUNTER — Other Ambulatory Visit: Payer: 59

## 2021-05-29 ENCOUNTER — Encounter: Payer: Self-pay | Admitting: Nurse Practitioner

## 2021-05-29 ENCOUNTER — Inpatient Hospital Stay: Payer: Medicare (Managed Care)

## 2021-05-29 ENCOUNTER — Other Ambulatory Visit: Payer: Self-pay

## 2021-05-29 ENCOUNTER — Inpatient Hospital Stay: Payer: Medicare (Managed Care) | Admitting: Nurse Practitioner

## 2021-05-29 ENCOUNTER — Inpatient Hospital Stay: Payer: Medicare (Managed Care) | Attending: Oncology

## 2021-05-29 VITALS — BP 121/77 | HR 76 | Temp 97.8°F | Resp 20 | Ht 66.0 in | Wt 193.6 lb

## 2021-05-29 DIAGNOSIS — Z79899 Other long term (current) drug therapy: Secondary | ICD-10-CM | POA: Insufficient documentation

## 2021-05-29 DIAGNOSIS — C4362 Malignant melanoma of left upper limb, including shoulder: Secondary | ICD-10-CM

## 2021-05-29 DIAGNOSIS — Z5112 Encounter for antineoplastic immunotherapy: Secondary | ICD-10-CM | POA: Insufficient documentation

## 2021-05-29 DIAGNOSIS — I10 Essential (primary) hypertension: Secondary | ICD-10-CM | POA: Insufficient documentation

## 2021-05-29 LAB — CBC WITH DIFFERENTIAL (CANCER CENTER ONLY)
Abs Immature Granulocytes: 0.01 10*3/uL (ref 0.00–0.07)
Basophils Absolute: 0 10*3/uL (ref 0.0–0.1)
Basophils Relative: 1 %
Eosinophils Absolute: 0.4 10*3/uL (ref 0.0–0.5)
Eosinophils Relative: 7 %
HCT: 40.2 % (ref 39.0–52.0)
Hemoglobin: 13.6 g/dL (ref 13.0–17.0)
Immature Granulocytes: 0 %
Lymphocytes Relative: 34 %
Lymphs Abs: 1.9 10*3/uL (ref 0.7–4.0)
MCH: 32.9 pg (ref 26.0–34.0)
MCHC: 33.8 g/dL (ref 30.0–36.0)
MCV: 97.3 fL (ref 80.0–100.0)
Monocytes Absolute: 0.7 10*3/uL (ref 0.1–1.0)
Monocytes Relative: 13 %
Neutro Abs: 2.6 10*3/uL (ref 1.7–7.7)
Neutrophils Relative %: 45 %
Platelet Count: 292 10*3/uL (ref 150–400)
RBC: 4.13 MIL/uL — ABNORMAL LOW (ref 4.22–5.81)
RDW: 12.7 % (ref 11.5–15.5)
WBC Count: 5.6 10*3/uL (ref 4.0–10.5)
nRBC: 0 % (ref 0.0–0.2)

## 2021-05-29 LAB — CMP (CANCER CENTER ONLY)
ALT: 22 U/L (ref 0–44)
AST: 19 U/L (ref 15–41)
Albumin: 4.3 g/dL (ref 3.5–5.0)
Alkaline Phosphatase: 115 U/L (ref 38–126)
Anion gap: 9 (ref 5–15)
BUN: 22 mg/dL (ref 8–23)
CO2: 24 mmol/L (ref 22–32)
Calcium: 9.4 mg/dL (ref 8.9–10.3)
Chloride: 105 mmol/L (ref 98–111)
Creatinine: 1.22 mg/dL (ref 0.61–1.24)
GFR, Estimated: >60 mL/min (ref >60)
Glucose, Bld: 102 mg/dL — ABNORMAL HIGH (ref 70–99)
Potassium: 3.7 mmol/L (ref 3.5–5.1)
Sodium: 138 mmol/L (ref 135–145)
Total Bilirubin: 0.6 mg/dL (ref 0.3–1.2)
Total Protein: 7 g/dL (ref 6.5–8.1)

## 2021-05-29 LAB — TSH: TSH: 0.927 u[IU]/mL (ref 0.350–4.500)

## 2021-05-29 MED ORDER — SODIUM CHLORIDE 0.9 % IV SOLN
Freq: Once | INTRAVENOUS | Status: AC
  Filled 2021-05-29: qty 250

## 2021-05-29 MED ORDER — SODIUM CHLORIDE 0.9 % IV SOLN
200.0000 mg | Freq: Once | INTRAVENOUS | Status: AC
  Administered 2021-05-29: 200 mg via INTRAVENOUS
  Filled 2021-05-29: qty 8

## 2021-05-29 NOTE — Progress Notes (Signed)
  [facility] OFFICE PROGRESS NOTE   Diagnosis: Melanoma  INTERVAL HISTORY:   Mr. Honor Junes returns as scheduled.  He completed another cycle of Pembrolizumab 05/08/2021.  He had a recent rash in the right groin, resolved following application of a powder.  His incision has healed.  He has intermittent loose stools, unchanged from baseline bowel habits.  No nausea or vomiting.  Objective:  Vital signs in last 24 hours:  Blood pressure 121/77, pulse 76, temperature 97.8 F (36.6 C), temperature source Oral, resp. rate 20, height $RemoveBe'5\' 6"'[id number]$  (1.676 m), weight 193 lb 9.6 oz (87.8 kg), SpO2 100 %.    HEENT: No thrush or ulcers. Resp: Lungs clear bilaterally. Cardio: Regular rate and rhythm. GI: Abdomen soft and nontender.  No hepatomegaly. Vascular: No leg edema. Neuro: Alert and oriented. Skin: Healed surgical incision right hip.  Healed incision left forearm.   Lab Results:  Lab Results  Component Value Date   WBC 5.6 05/29/2021   HGB 13.6 05/29/2021   HCT 40.2 05/29/2021   MCV 97.3 05/29/2021   PLT 292 05/29/2021   NEUTROABS 2.6 05/29/2021    Imaging:  No results found.  Medications: I have reviewed the patient's current medications.  Assessment/Plan: Stage IIIC melanoma (pT4apN1a), status post a radical excision of the left forearm melanoma, left axillary sentinel lymph node mapping/biopsy, and full thickness skin graft at the resection site 01/31/2021 Biopsy of left arm melanoma 12/28/2020-clinical stage IIb (cT3bcN0cM0), ulceration present, mitotic rate of 2/mm2 CTs chest, abdomen, and pelvis 01/31/2021-no evidence of metastatic disease 01/31/2021- melanoma reexcision: Breslow depth 4.24 mm, Clark level V, 6 mitoses per millimeter squared, ulceration absent,pT4a, 1/6 lymph nodes with a 5 mm deposit of metastatic melanoma,pN1a, BRAF mutation not detected Cycle 1 pembrolizumab 03/27/2021 Cycle 2 pembrolizumab 04/17/2021 Cycle 3 pembrolizumab 05/08/2021 Cycle 4  Pembrolizumab 05/29/2021 History of hepatitis B Hypertension Right hip osteoarthritis 5.   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   Right hip replacement surgery 05/02/2021    Disposition: Mr. Honor Junes appears stable.  He is tolerating pembrolizumab well.  Plan to continue every 3 weeks.  We reviewed the CBC and chemistry panel from today.  Labs adequate to proceed with treatment.  TSH is pending.  He will return for lab, follow-up, Pembrolizumab in 3 weeks.    Galo Sayed ANP/GNP-BC   05/29/2021  10:14 AM

## 2021-05-29 NOTE — Patient Instructions (Signed)
[facility]   Discharge Instructions: Thank you for choosing [facility] to provide your oncology and hematology care.   If you have a lab appointment with the Cancer Center, please go directly to the Cancer Center and check in at the registration area.   Wear comfortable clothing and clothing appropriate for easy access to any Portacath or PICC line.   We strive to give you quality time with your provider. You may need to reschedule your appointment if you arrive late (15 or more minutes).  Arriving late affects you and other patients whose appointments are after yours.  Also, if you miss three or more appointments without notifying the office, you may be dismissed from the clinic at the provider's discretion.      For prescription refill requests, have your pharmacy contact our office and allow 72 hours for refills to be completed.    Today you received the following chemotherapy and/or immunotherapy agents Pembrolizumab (KEYTRUDA).      To help prevent nausea and vomiting after your treatment, we encourage you to take your nausea medication as directed.  BELOW ARE SYMPTOMS THAT SHOULD BE REPORTED IMMEDIATELY: *FEVER GREATER THAN 100.4 F (38 C) OR HIGHER *CHILLS OR SWEATING *NAUSEA AND VOMITING THAT IS NOT CONTROLLED WITH YOUR NAUSEA MEDICATION *UNUSUAL SHORTNESS OF BREATH *UNUSUAL BRUISING OR BLEEDING *URINARY PROBLEMS (pain or burning when urinating, or frequent urination) *BOWEL PROBLEMS (unusual diarrhea, constipation, pain near the anus) TENDERNESS IN MOUTH AND THROAT WITH OR WITHOUT PRESENCE OF ULCERS (sore throat, sores in mouth, or a toothache) UNUSUAL RASH, SWELLING OR PAIN  UNUSUAL VAGINAL DISCHARGE OR ITCHING   Items with * indicate a potential emergency and should be followed up as soon as possible or go to the Emergency Department if any problems should occur.  Please show the CHEMOTHERAPY ALERT CARD or IMMUNOTHERAPY ALERT CARD  at check-in to the Emergency Department and triage nurse.  Should you have questions after your visit or need to cancel or reschedule your appointment, please contact [facility]  Dept: [phone]  and follow the prompts.  Office hours are 8:00 a.m. to 4:30 p.m. Monday - Friday. Please note that voicemails left after 4:00 p.m. may not be returned until the following business day.  We are closed weekends and major holidays. You have access to a nurse at all times for urgent questions. Please call the main number to the clinic Dept: [phone] and follow the prompts.   For any non-urgent questions, you may also contact your provider using MyChart. We now offer e-Visits for anyone 18 and older to request care online for non-urgent symptoms. For details visit [email or web address].   Also download the MyChart app! Go to the app store, search "MyChart", open the app, select [facility], and log in with your MyChart username and password.  Due to Covid, a mask is required upon entering the hospital/clinic. If you do not have a mask, one will be given to you upon arrival. For doctor visits, patients may have 1 support person aged 18 or older with them. For treatment visits, patients cannot have anyone with them due to current Covid guidelines and our immunocompromised population.   Pembrolizumab injection What is this medication? PEMBROLIZUMAB (pem broe liz ue mab) is a monoclonal antibody. It is used totreat certain types of cancer. This medicine may be used for other purposes; ask your health care provider orpharmacist if you have questions. COMMON BRAND NAME(S): Keytruda What   should I tell my care team before I take this medication? They need to know if you have any of these conditions: autoimmune diseases like Crohn's disease, ulcerative colitis, or lupus have had or planning to have an allogeneic stem cell transplant (uses someone else's stem cells) history of organ  transplant history of chest radiation nervous system problems like myasthenia gravis or Guillain-Barre syndrome an unusual or allergic reaction to pembrolizumab, other medicines, foods, dyes, or preservatives pregnant or trying to get pregnant breast-feeding How should I use this medication? This medicine is for infusion into a vein. It is given by a health careprofessional in a hospital or clinic setting. A special MedGuide will be given to you before each treatment. Be sure to readthis information carefully each time. Talk to your pediatrician regarding the use of this medicine in children. While this drug may be prescribed for children as young as 6 months for selectedconditions, precautions do apply. Overdosage: If you think you have taken too much of this medicine contact apoison control center or emergency room at once. NOTE: This medicine is only for you. Do not share this medicine with others. What if I miss a dose? It is important not to miss your dose. Call your doctor or health careprofessional if you are unable to keep an appointment. What may interact with this medication? Interactions have not been studied. This list may not describe all possible interactions. Give your health care provider a list of all the medicines, herbs, non-prescription drugs, or dietary supplements you use. Also tell them if you smoke, drink alcohol, or use illegaldrugs. Some items may interact with your medicine. What should I watch for while using this medication? Your condition will be monitored carefully while you are receiving thismedicine. You may need blood work done while you are taking this medicine. Do not become pregnant while taking this medicine or for 4 months after stopping it. Women should inform their doctor if they wish to become pregnant or think they might be pregnant. There is a potential for serious side effects to an unborn child. Talk to your health care professional or pharmacist for  more information. Do not breast-feed an infant while taking this medicine orfor 4 months after the last dose. What side effects may I notice from receiving this medication? Side effects that you should report to your doctor or health care professionalas soon as possible: allergic reactions like skin rash, itching or hives, swelling of the face, lips, or tongue bloody or black, tarry breathing problems changes in vision chest pain chills confusion constipation cough diarrhea dizziness or feeling faint or lightheaded fast or irregular heartbeat fever flushing joint pain low blood counts - this medicine may decrease the number of white blood cells, red blood cells and platelets. You may be at increased risk for infections and bleeding. muscle pain muscle weakness pain, tingling, numbness in the hands or feet persistent headache redness, blistering, peeling or loosening of the skin, including inside the mouth signs and symptoms of high blood sugar such as dizziness; dry mouth; dry skin; fruity breath; nausea; stomach pain; increased hunger or thirst; increased urination signs and symptoms of kidney injury like trouble passing urine or change in the amount of urine signs and symptoms of liver injury like dark urine, light-colored stools, loss of appetite, nausea, right upper belly pain, yellowing of the eyes or skin sweating swollen lymph nodes weight loss Side effects that usually do not require medical attention (report to yourdoctor or health care professional if   they continue or are bothersome): decreased appetite hair loss tiredness This list may not describe all possible side effects. Call your doctor for medical advice about side effects. You may report side effects to FDA at1-800-FDA-1088. Where should I keep my medication? This drug is given in a hospital or clinic and will not be stored at home. NOTE: This sheet is a summary. It may not cover all possible information. If you  have questions about this medicine, talk to your doctor, pharmacist, orhealth care provider.  2022 Elsevier/Gold Standard (2019-10-12 21:44:53)  

## 2021-05-30 ENCOUNTER — Encounter: Payer: Self-pay | Admitting: Oncology

## 2021-05-31 ENCOUNTER — Encounter: Payer: Self-pay | Admitting: Oncology

## 2021-06-03 ENCOUNTER — Encounter: Payer: Self-pay | Admitting: Oncology

## 2021-06-04 ENCOUNTER — Encounter: Payer: Self-pay | Admitting: Oncology

## 2021-06-15 ENCOUNTER — Other Ambulatory Visit: Payer: Self-pay | Admitting: Oncology

## 2021-06-17 ENCOUNTER — Inpatient Hospital Stay: Payer: Medicare (Managed Care)

## 2021-06-17 ENCOUNTER — Encounter: Payer: Self-pay | Admitting: Nurse Practitioner

## 2021-06-17 ENCOUNTER — Other Ambulatory Visit: Payer: Self-pay

## 2021-06-17 ENCOUNTER — Encounter: Payer: Self-pay | Admitting: Oncology

## 2021-06-17 ENCOUNTER — Inpatient Hospital Stay: Payer: Medicare (Managed Care) | Admitting: Oncology

## 2021-06-17 ENCOUNTER — Other Ambulatory Visit: Payer: Self-pay | Admitting: Oncology

## 2021-06-17 ENCOUNTER — Ambulatory Visit (HOSPITAL_COMMUNITY)
Admission: RE | Admit: 2021-06-17 | Discharge: 2021-06-17 | Disposition: A | Discharge status: Home / Self Care | Payer: Medicare Other | Source: Ambulatory Visit | Attending: Oncology | Admitting: Oncology

## 2021-06-17 VITALS — BP 116/70 | HR 65 | Temp 98.6°F | Resp 18 | Wt 197.2 lb

## 2021-06-17 DIAGNOSIS — C4362 Malignant melanoma of left upper limb, including shoulder: Secondary | ICD-10-CM | POA: Insufficient documentation

## 2021-06-17 DIAGNOSIS — Z5112 Encounter for antineoplastic immunotherapy: Secondary | ICD-10-CM | POA: Diagnosis not present

## 2021-06-17 LAB — CBC WITH DIFFERENTIAL (CANCER CENTER ONLY)
Abs Immature Granulocytes: 0.02 10*3/uL (ref 0.00–0.07)
Basophils Absolute: 0.1 10*3/uL (ref 0.0–0.1)
Basophils Relative: 1 %
Eosinophils Absolute: 0.6 10*3/uL — ABNORMAL HIGH (ref 0.0–0.5)
Eosinophils Relative: 10 %
HCT: 39.7 % (ref 39.0–52.0)
Hemoglobin: 13.3 g/dL (ref 13.0–17.0)
Immature Granulocytes: 0 %
Lymphocytes Relative: 28 %
Lymphs Abs: 1.8 10*3/uL (ref 0.7–4.0)
MCH: 32.6 pg (ref 26.0–34.0)
MCHC: 33.5 g/dL (ref 30.0–36.0)
MCV: 97.3 fL (ref 80.0–100.0)
Monocytes Absolute: 0.7 10*3/uL (ref 0.1–1.0)
Monocytes Relative: 11 %
Neutro Abs: 3.1 10*3/uL (ref 1.7–7.7)
Neutrophils Relative %: 50 %
Platelet Count: 248 10*3/uL (ref 150–400)
RBC: 4.08 MIL/uL — ABNORMAL LOW (ref 4.22–5.81)
RDW: 12.4 % (ref 11.5–15.5)
WBC Count: 6.2 10*3/uL (ref 4.0–10.5)
nRBC: 0 % (ref 0.0–0.2)

## 2021-06-17 LAB — CMP (CANCER CENTER ONLY)
ALT: 22 U/L (ref 0–44)
AST: 18 U/L (ref 15–41)
Albumin: 4.1 g/dL (ref 3.5–5.0)
Alkaline Phosphatase: 106 U/L (ref 38–126)
Anion gap: 9 (ref 5–15)
BUN: 19 mg/dL (ref 8–23)
CO2: 24 mmol/L (ref 22–32)
Calcium: 9 mg/dL (ref 8.9–10.3)
Chloride: 107 mmol/L (ref 98–111)
Creatinine: 1.02 mg/dL (ref 0.61–1.24)
GFR, Estimated: >60 mL/min (ref >60)
Glucose, Bld: 94 mg/dL (ref 70–99)
Potassium: 4.2 mmol/L (ref 3.5–5.1)
Sodium: 140 mmol/L (ref 135–145)
Total Bilirubin: 0.6 mg/dL (ref 0.3–1.2)
Total Protein: 6.7 g/dL (ref 6.5–8.1)

## 2021-06-17 MED ORDER — SODIUM CHLORIDE 0.9 % IV SOLN
Freq: Once | INTRAVENOUS | Status: AC
  Filled 2021-06-17: qty 250

## 2021-06-17 MED ORDER — SODIUM CHLORIDE 0.9 % IV SOLN
200.0000 mg | Freq: Once | INTRAVENOUS | Status: AC
  Administered 2021-06-17: 200 mg via INTRAVENOUS
  Filled 2021-06-17: qty 8

## 2021-06-17 NOTE — Progress Notes (Signed)
  [facility] OFFICE PROGRESS NOTE   Diagnosis: Melanoma  INTERVAL HISTORY:   [name] completed another treat with pembrolizumab on 05/29/2021.  No rash or diarrhea.  He has congestion in the right ear relieved with an over-the-counter cold preparation.  [name] has noted his left leg is enlarged.  He denies leg pain or erythema.  Objective:  Vital signs in last 24 hours:  Blood pressure 116/70, pulse 65, temperature 98.6 F (37 C), temperature source Oral, resp. rate 18, weight 197 lb 3.2 oz (89.4 kg), SpO2 99 %.    Resp: Lungs clear bilaterally Cardio: Regular rate and rhythm, distant heart sounds GI: No hepatosplenomegaly Vascular: The left lower leg is larger than the right side, no erythema or tenderness.  Skin: Healed left forearm and axillary incision   Lab Results:  Lab Results  Component Value Date   WBC 6.2 06/17/2021   HGB 13.3 06/17/2021   HCT 39.7 06/17/2021   MCV 97.3 06/17/2021   PLT 248 06/17/2021   NEUTROABS 3.1 06/17/2021    CMP  Lab Results  Component Value Date   NA 140 06/17/2021   K 4.2 06/17/2021   CL 107 06/17/2021   CO2 24 06/17/2021   GLUCOSE 94 06/17/2021   BUN 19 06/17/2021   CREATININE 1.02 06/17/2021   CALCIUM 9.0 06/17/2021   PROT 6.7 06/17/2021   ALBUMIN 4.1 06/17/2021   AST 18 06/17/2021   ALT 22 06/17/2021   ALKPHOS 106 06/17/2021   BILITOT 0.6 06/17/2021   GFRNONAA >60 06/17/2021     Medications: I have reviewed the patient's current medications.   Assessment/Plan: Stage IIIC melanoma (pT4apN1a), status post a radical excision of the left forearm melanoma, left axillary sentinel lymph node mapping/biopsy, and full thickness skin graft at the resection site 01/31/2021 Biopsy of left arm melanoma 12/28/2020-clinical stage IIb ([id number]), ulceration present, mitotic rate of 2/mm2 CTs chest, abdomen, and pelvis 01/31/2021-no evidence of metastatic disease 01/31/2021- melanoma reexcision: Breslow depth  4.24 mm, Clark level V, 6 mitoses per millimeter squared, ulceration absent,pT4a, 1/6 lymph nodes with a 5 mm deposit of metastatic melanoma,pN1a, BRAF mutation not detected Cycle 1 pembrolizumab 03/27/2021 Cycle 2 pembrolizumab 04/17/2021 Cycle 3 pembrolizumab 05/08/2021 Cycle 4 Pembrolizumab 05/29/2021 Cycle 5 pembrolizumab 06/17/2021 History of hepatitis B Hypertension Right hip osteoarthritis 5.   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   Right hip replacement surgery 05/02/2021    Disposition: [name] continues to tolerate the pembrolizumab well.  He will complete another cycle today.  He plans to relocate to [place] after the treatment here on 07/08/2021.  The plan is to change to every 6-week dosing beginning on 07/08/2021.  On We will check a Doppler of the left leg to rule out a DVT.  Betsy Coder, MD  06/17/2021  11:23 AM

## 2021-06-17 NOTE — Progress Notes (Signed)
Left lower extremity venous duplex completed. Refer to "CV Proc" under chart review to view preliminary results.  06/17/2021 3:28 PM Kelby Aline., MHA, RVT, RDCS, RDMS

## 2021-06-17 NOTE — Patient Instructions (Signed)
[facility]   Discharge Instructions: Thank you for choosing [facility] to provide your oncology and hematology care.   If you have a lab appointment with the Cancer Center, please go directly to the Cancer Center and check in at the registration area.   Wear comfortable clothing and clothing appropriate for easy access to any Portacath or PICC line.   We strive to give you quality time with your provider. You may need to reschedule your appointment if you arrive late (15 or more minutes).  Arriving late affects you and other patients whose appointments are after yours.  Also, if you miss three or more appointments without notifying the office, you may be dismissed from the clinic at the provider's discretion.      For prescription refill requests, have your pharmacy contact our office and allow 72 hours for refills to be completed.    Today you received the following chemotherapy and/or immunotherapy agents Pembrolizumab (KEYTRUDA).      To help prevent nausea and vomiting after your treatment, we encourage you to take your nausea medication as directed.  BELOW ARE SYMPTOMS THAT SHOULD BE REPORTED IMMEDIATELY: *FEVER GREATER THAN 100.4 F (38 C) OR HIGHER *CHILLS OR SWEATING *NAUSEA AND VOMITING THAT IS NOT CONTROLLED WITH YOUR NAUSEA MEDICATION *UNUSUAL SHORTNESS OF BREATH *UNUSUAL BRUISING OR BLEEDING *URINARY PROBLEMS (pain or burning when urinating, or frequent urination) *BOWEL PROBLEMS (unusual diarrhea, constipation, pain near the anus) TENDERNESS IN MOUTH AND THROAT WITH OR WITHOUT PRESENCE OF ULCERS (sore throat, sores in mouth, or a toothache) UNUSUAL RASH, SWELLING OR PAIN  UNUSUAL VAGINAL DISCHARGE OR ITCHING   Items with * indicate a potential emergency and should be followed up as soon as possible or go to the Emergency Department if any problems should occur.  Please show the CHEMOTHERAPY ALERT CARD or IMMUNOTHERAPY ALERT CARD  at check-in to the Emergency Department and triage nurse.  Should you have questions after your visit or need to cancel or reschedule your appointment, please contact [facility]  Dept: [phone]  and follow the prompts.  Office hours are 8:00 a.m. to 4:30 p.m. Monday - Friday. Please note that voicemails left after 4:00 p.m. may not be returned until the following business day.  We are closed weekends and major holidays. You have access to a nurse at all times for urgent questions. Please call the main number to the clinic Dept: [phone] and follow the prompts.   For any non-urgent questions, you may also contact your provider using MyChart. We now offer e-Visits for anyone 18 and older to request care online for non-urgent symptoms. For details visit [email or web address].   Also download the MyChart app! Go to the app store, search "MyChart", open the app, select [facility], and log in with your MyChart username and password.  Due to Covid, a mask is required upon entering the hospital/clinic. If you do not have a mask, one will be given to you upon arrival. For doctor visits, patients may have 1 support person aged 18 or older with them. For treatment visits, patients cannot have anyone with them due to current Covid guidelines and our immunocompromised population.   Pembrolizumab injection What is this medication? PEMBROLIZUMAB (pem broe liz ue mab) is a monoclonal antibody. It is used totreat certain types of cancer. This medicine may be used for other purposes; ask your health care provider orpharmacist if you have questions. COMMON BRAND NAME(S): Keytruda What   should I tell my care team before I take this medication? They need to know if you have any of these conditions: autoimmune diseases like Crohn's disease, ulcerative colitis, or lupus have had or planning to have an allogeneic stem cell transplant (uses someone else's stem cells) history of organ  transplant history of chest radiation nervous system problems like myasthenia gravis or Guillain-Barre syndrome an unusual or allergic reaction to pembrolizumab, other medicines, foods, dyes, or preservatives pregnant or trying to get pregnant breast-feeding How should I use this medication? This medicine is for infusion into a vein. It is given by a health careprofessional in a hospital or clinic setting. A special MedGuide will be given to you before each treatment. Be sure to readthis information carefully each time. Talk to your pediatrician regarding the use of this medicine in children. While this drug may be prescribed for children as young as 6 months for selectedconditions, precautions do apply. Overdosage: If you think you have taken too much of this medicine contact apoison control center or emergency room at once. NOTE: This medicine is only for you. Do not share this medicine with others. What if I miss a dose? It is important not to miss your dose. Call your doctor or health careprofessional if you are unable to keep an appointment. What may interact with this medication? Interactions have not been studied. This list may not describe all possible interactions. Give your health care provider a list of all the medicines, herbs, non-prescription drugs, or dietary supplements you use. Also tell them if you smoke, drink alcohol, or use illegaldrugs. Some items may interact with your medicine. What should I watch for while using this medication? Your condition will be monitored carefully while you are receiving thismedicine. You may need blood work done while you are taking this medicine. Do not become pregnant while taking this medicine or for 4 months after stopping it. Women should inform their doctor if they wish to become pregnant or think they might be pregnant. There is a potential for serious side effects to an unborn child. Talk to your health care professional or pharmacist for  more information. Do not breast-feed an infant while taking this medicine orfor 4 months after the last dose. What side effects may I notice from receiving this medication? Side effects that you should report to your doctor or health care professionalas soon as possible: allergic reactions like skin rash, itching or hives, swelling of the face, lips, or tongue bloody or black, tarry breathing problems changes in vision chest pain chills confusion constipation cough diarrhea dizziness or feeling faint or lightheaded fast or irregular heartbeat fever flushing joint pain low blood counts - this medicine may decrease the number of white blood cells, red blood cells and platelets. You may be at increased risk for infections and bleeding. muscle pain muscle weakness pain, tingling, numbness in the hands or feet persistent headache redness, blistering, peeling or loosening of the skin, including inside the mouth signs and symptoms of high blood sugar such as dizziness; dry mouth; dry skin; fruity breath; nausea; stomach pain; increased hunger or thirst; increased urination signs and symptoms of kidney injury like trouble passing urine or change in the amount of urine signs and symptoms of liver injury like dark urine, light-colored stools, loss of appetite, nausea, right upper belly pain, yellowing of the eyes or skin sweating swollen lymph nodes weight loss Side effects that usually do not require medical attention (report to yourdoctor or health care professional if   they continue or are bothersome): decreased appetite hair loss tiredness This list may not describe all possible side effects. Call your doctor for medical advice about side effects. You may report side effects to FDA at1-800-FDA-1088. Where should I keep my medication? This drug is given in a hospital or clinic and will not be stored at home. NOTE: This sheet is a summary. It may not cover all possible information. If you  have questions about this medicine, talk to your doctor, pharmacist, orhealth care provider.  2022 Elsevier/Gold Standard (2019-10-12 21:44:53)  

## 2021-06-18 ENCOUNTER — Encounter: Payer: Self-pay | Admitting: *Deleted

## 2021-06-18 NOTE — Progress Notes (Signed)
Patient will be in [place] for the remainder of summer. Faxed last office note, labs, chemo care plan, chemo flows heets and doppler report to Dr. Regan Lemming at [phone] as patient had requested.

## 2021-06-19 ENCOUNTER — Inpatient Hospital Stay: Payer: Medicare (Managed Care)

## 2021-06-19 ENCOUNTER — Inpatient Hospital Stay: Payer: Medicare (Managed Care) | Admitting: Oncology

## 2021-06-19 ENCOUNTER — Other Ambulatory Visit: Payer: 59

## 2021-06-20 ENCOUNTER — Encounter: Payer: Self-pay | Admitting: Oncology

## 2021-07-07 ENCOUNTER — Other Ambulatory Visit: Payer: Self-pay | Admitting: Oncology

## 2021-07-08 ENCOUNTER — Encounter: Payer: Self-pay | Admitting: *Deleted

## 2021-07-08 ENCOUNTER — Inpatient Hospital Stay: Payer: Medicare (Managed Care) | Attending: Oncology

## 2021-07-08 ENCOUNTER — Other Ambulatory Visit: Payer: Self-pay

## 2021-07-08 ENCOUNTER — Inpatient Hospital Stay: Payer: Medicare (Managed Care) | Admitting: Oncology

## 2021-07-08 ENCOUNTER — Inpatient Hospital Stay: Payer: Medicare (Managed Care)

## 2021-07-08 VITALS — BP 126/67 | HR 62 | Temp 97.8°F | Resp 18 | Ht 66.0 in | Wt 197.2 lb

## 2021-07-08 DIAGNOSIS — I10 Essential (primary) hypertension: Secondary | ICD-10-CM | POA: Diagnosis not present

## 2021-07-08 DIAGNOSIS — Z5112 Encounter for antineoplastic immunotherapy: Secondary | ICD-10-CM | POA: Diagnosis not present

## 2021-07-08 DIAGNOSIS — Z79899 Other long term (current) drug therapy: Secondary | ICD-10-CM | POA: Insufficient documentation

## 2021-07-08 DIAGNOSIS — C4362 Malignant melanoma of left upper limb, including shoulder: Secondary | ICD-10-CM

## 2021-07-08 LAB — CMP (CANCER CENTER ONLY)
ALT: 26 U/L (ref 0–44)
AST: 20 U/L (ref 15–41)
Albumin: 4.2 g/dL (ref 3.5–5.0)
Alkaline Phosphatase: 91 U/L (ref 38–126)
Anion gap: 9 (ref 5–15)
BUN: 19 mg/dL (ref 8–23)
CO2: 25 mmol/L (ref 22–32)
Calcium: 8.8 mg/dL — ABNORMAL LOW (ref 8.9–10.3)
Chloride: 104 mmol/L (ref 98–111)
Creatinine: 1 mg/dL (ref 0.61–1.24)
GFR, Estimated: >60 mL/min (ref >60)
Glucose, Bld: 89 mg/dL (ref 70–99)
Potassium: 3.9 mmol/L (ref 3.5–5.1)
Sodium: 138 mmol/L (ref 135–145)
Total Bilirubin: 0.7 mg/dL (ref 0.3–1.2)
Total Protein: 6.7 g/dL (ref 6.5–8.1)

## 2021-07-08 LAB — TSH: TSH: 1.58 u[IU]/mL (ref 0.350–4.500)

## 2021-07-08 LAB — CBC WITH DIFFERENTIAL (CANCER CENTER ONLY)
Abs Immature Granulocytes: 0.02 10*3/uL (ref 0.00–0.07)
Basophils Absolute: 0 10*3/uL (ref 0.0–0.1)
Basophils Relative: 1 %
Eosinophils Absolute: 0.6 10*3/uL — ABNORMAL HIGH (ref 0.0–0.5)
Eosinophils Relative: 10 %
HCT: 40.4 % (ref 39.0–52.0)
Hemoglobin: 13.6 g/dL (ref 13.0–17.0)
Immature Granulocytes: 0 %
Lymphocytes Relative: 24 %
Lymphs Abs: 1.5 10*3/uL (ref 0.7–4.0)
MCH: 32.9 pg (ref 26.0–34.0)
MCHC: 33.7 g/dL (ref 30.0–36.0)
MCV: 97.6 fL (ref 80.0–100.0)
Monocytes Absolute: 0.7 10*3/uL (ref 0.1–1.0)
Monocytes Relative: 11 %
Neutro Abs: 3.4 10*3/uL (ref 1.7–7.7)
Neutrophils Relative %: 54 %
Platelet Count: 238 10*3/uL (ref 150–400)
RBC: 4.14 MIL/uL — ABNORMAL LOW (ref 4.22–5.81)
RDW: 12.7 % (ref 11.5–15.5)
WBC Count: 6.3 10*3/uL (ref 4.0–10.5)
nRBC: 0 % (ref 0.0–0.2)

## 2021-07-08 MED ORDER — SODIUM CHLORIDE 0.9 % IV SOLN
400.0000 mg | Freq: Once | INTRAVENOUS | Status: AC
  Administered 2021-07-08: 400 mg via INTRAVENOUS
  Filled 2021-07-08: qty 16

## 2021-07-08 MED ORDER — SODIUM CHLORIDE 0.9 % IV SOLN: Freq: Once | INTRAVENOUS | Status: AC

## 2021-07-08 NOTE — Progress Notes (Signed)
  [facility] OFFICE PROGRESS NOTE   Diagnosis: Melanoma  INTERVAL HISTORY: Steven Morrow completed another cycle of pembrolizumab on 06/17/2021.  No rash or diarrhea.  Hip pain continues to improve.  He has developed "blockage "of both ears.  A Doppler of the left leg was negative for DVT on 06/17/2021.  Objective:  Vital signs in last 24 hours:  Blood pressure 126/67, pulse 62, temperature 97.8 F (36.6 C), temperature source Oral, resp. rate 18, height 5' 6" (1.676 m), weight 197 lb 3.2 oz (89.4 kg), SpO2 100 %.    HEENT: Both external canals have cerumen, I cannot see past the cerumen on either side Lymphatics: No cervical, supraclavicular, axillary, or inguinal nodes Resp: Lungs clear bilaterally Cardio: Regular rate and rhythm GI: No hepatosplenomegaly Vascular: Trace edema of the left leg below the knee  Skin: Left arm scar without evidence of recurrent tumor   Lab Results:  Lab Results  Component Value Date   WBC 6.3 07/08/2021   HGB 13.6 07/08/2021   HCT 40.4 07/08/2021   MCV 97.6 07/08/2021   PLT 238 07/08/2021   NEUTROABS 3.4 07/08/2021    CMP  Lab Results  Component Value Date   NA 138 07/08/2021   K 3.9 07/08/2021   CL 104 07/08/2021   CO2 25 07/08/2021   GLUCOSE 89 07/08/2021   BUN 19 07/08/2021   CREATININE 1.00 07/08/2021   CALCIUM 8.8 (L) 07/08/2021   PROT 6.7 07/08/2021   ALBUMIN 4.2 07/08/2021   AST 20 07/08/2021   ALT 26 07/08/2021   ALKPHOS 91 07/08/2021   BILITOT 0.7 07/08/2021   GFRNONAA >60 07/08/2021    Medications: I have reviewed the patient's current medications.   Assessment/Plan: Stage IIIC melanoma (pT4apN1a), status post a radical excision of the left forearm melanoma, left axillary sentinel lymph node mapping/biopsy, and full thickness skin graft at the resection site 01/31/2021 Biopsy of left arm melanoma 12/28/2020-clinical stage IIb ([id number]), ulceration present, mitotic rate of 2/mm2 CTs chest, abdomen,  and pelvis 01/31/2021-no evidence of metastatic disease 01/31/2021- melanoma reexcision: Breslow depth 4.24 mm, Clark level V, 6 mitoses per millimeter squared, ulceration absent,pT4a, 1/6 lymph nodes with a 5 mm deposit of metastatic melanoma,pN1a, BRAF mutation not detected Cycle 1 pembrolizumab 03/27/2021 Cycle 2 pembrolizumab 04/17/2021 Cycle 3 pembrolizumab 05/08/2021 Cycle 4 Pembrolizumab 05/29/2021 Cycle 5 pembrolizumab 06/17/2021 Cycle 6 pembrolizumab 07/08/2021 (changed to 6-week dosing) History of hepatitis B Hypertension Right hip osteoarthritis 5.   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   Right hip replacement surgery 05/02/2021 7.   Left lower leg swelling 06/17/2021-negative Doppler 06/17/2021      Disposition: Steven Morrow appears stable.  He is tolerating the pembrolizumab well.  He will complete another cycle today.  We changed to every 6-week dosing as he will be relocating to [place] over the next month.  He is scheduled to see Dr. Okley in [place] on 08/06/2021.  We will forward a note to Dr. Arnulfo.  Steven Morrow plans to return here in late March/early April 2023.  He will be scheduled for an office visit here during the first week of April 2023.  He has excessive cerumen in both external canals.  He will try an over-the-counter treatment.  If this is not helpful he will follow-up with a primary provider or ENT for removal of the cerumen.    Gary Sherrill, MD  07/08/2021  12:24 PM    

## 2021-07-08 NOTE — Patient Instructions (Signed)
[facility]   Discharge Instructions: Thank you for choosing [facility] to provide your oncology and hematology care.   If you have a lab appointment with the Cancer Center, please go directly to the Cancer Center and check in at the registration area.   Wear comfortable clothing and clothing appropriate for easy access to any Portacath or PICC line.   We strive to give you quality time with your provider. You may need to reschedule your appointment if you arrive late (15 or more minutes).  Arriving late affects you and other patients whose appointments are after yours.  Also, if you miss three or more appointments without notifying the office, you may be dismissed from the clinic at the provider's discretion.      For prescription refill requests, have your pharmacy contact our office and allow 72 hours for refills to be completed.    Today you received the following chemotherapy and/or immunotherapy agents Pembrolizumab (KEYTRUDA).      To help prevent nausea and vomiting after your treatment, we encourage you to take your nausea medication as directed.  BELOW ARE SYMPTOMS THAT SHOULD BE REPORTED IMMEDIATELY: *FEVER GREATER THAN 100.4 F (38 C) OR HIGHER *CHILLS OR SWEATING *NAUSEA AND VOMITING THAT IS NOT CONTROLLED WITH YOUR NAUSEA MEDICATION *UNUSUAL SHORTNESS OF BREATH *UNUSUAL BRUISING OR BLEEDING *URINARY PROBLEMS (pain or burning when urinating, or frequent urination) *BOWEL PROBLEMS (unusual diarrhea, constipation, pain near the anus) TENDERNESS IN MOUTH AND THROAT WITH OR WITHOUT PRESENCE OF ULCERS (sore throat, sores in mouth, or a toothache) UNUSUAL RASH, SWELLING OR PAIN  UNUSUAL VAGINAL DISCHARGE OR ITCHING   Items with * indicate a potential emergency and should be followed up as soon as possible or go to the Emergency Department if any problems should occur.  Please show the CHEMOTHERAPY ALERT CARD or IMMUNOTHERAPY ALERT CARD  at check-in to the Emergency Department and triage nurse.  Should you have questions after your visit or need to cancel or reschedule your appointment, please contact [facility]  Dept: [phone]  and follow the prompts.  Office hours are 8:00 a.m. to 4:30 p.m. Monday - Friday. Please note that voicemails left after 4:00 p.m. may not be returned until the following business day.  We are closed weekends and major holidays. You have access to a nurse at all times for urgent questions. Please call the main number to the clinic Dept: [phone] and follow the prompts.   For any non-urgent questions, you may also contact your provider using MyChart. We now offer e-Visits for anyone 18 and older to request care online for non-urgent symptoms. For details visit [email or web address].   Also download the MyChart app! Go to the app store, search "MyChart", open the app, select , and log in with your MyChart username and password.  Due to Covid, a mask is required upon entering the hospital/clinic. If you do not have a mask, one will be given to you upon arrival. For doctor visits, patients may have 1 support person aged 18 or older with them. For treatment visits, patients cannot have anyone with them due to current Covid guidelines and our immunocompromised population.   Pembrolizumab injection What is this medication? PEMBROLIZUMAB (pem broe liz ue mab) is a monoclonal antibody. It is used totreat certain types of cancer. This medicine may be used for other purposes; ask your health care provider orpharmacist if you have questions. COMMON BRAND NAME(S): Keytruda What   should I tell my care team before I take this medication? They need to know if you have any of these conditions: autoimmune diseases like Crohn's disease, ulcerative colitis, or lupus have had or planning to have an allogeneic stem cell transplant (uses someone else's stem cells) history of organ  transplant history of chest radiation nervous system problems like myasthenia gravis or Guillain-Barre syndrome an unusual or allergic reaction to pembrolizumab, other medicines, foods, dyes, or preservatives pregnant or trying to get pregnant breast-feeding How should I use this medication? This medicine is for infusion into a vein. It is given by a health careprofessional in a hospital or clinic setting. A special MedGuide will be given to you before each treatment. Be sure to readthis information carefully each time. Talk to your pediatrician regarding the use of this medicine in children. While this drug may be prescribed for children as young as 6 months for selectedconditions, precautions do apply. Overdosage: If you think you have taken too much of this medicine contact apoison control center or emergency room at once. NOTE: This medicine is only for you. Do not share this medicine with others. What if I miss a dose? It is important not to miss your dose. Call your doctor or health careprofessional if you are unable to keep an appointment. What may interact with this medication? Interactions have not been studied. This list may not describe all possible interactions. Give your health care provider a list of all the medicines, herbs, non-prescription drugs, or dietary supplements you use. Also tell them if you smoke, drink alcohol, or use illegaldrugs. Some items may interact with your medicine. What should I watch for while using this medication? Your condition will be monitored carefully while you are receiving thismedicine. You may need blood work done while you are taking this medicine. Do not become pregnant while taking this medicine or for 4 months after stopping it. Women should inform their doctor if they wish to become pregnant or think they might be pregnant. There is a potential for serious side effects to an unborn child. Talk to your health care professional or pharmacist for  more information. Do not breast-feed an infant while taking this medicine orfor 4 months after the last dose. What side effects may I notice from receiving this medication? Side effects that you should report to your doctor or health care professionalas soon as possible: allergic reactions like skin rash, itching or hives, swelling of the face, lips, or tongue bloody or black, tarry breathing problems changes in vision chest pain chills confusion constipation cough diarrhea dizziness or feeling faint or lightheaded fast or irregular heartbeat fever flushing joint pain low blood counts - this medicine may decrease the number of white blood cells, red blood cells and platelets. You may be at increased risk for infections and bleeding. muscle pain muscle weakness pain, tingling, numbness in the hands or feet persistent headache redness, blistering, peeling or loosening of the skin, including inside the mouth signs and symptoms of high blood sugar such as dizziness; dry mouth; dry skin; fruity breath; nausea; stomach pain; increased hunger or thirst; increased urination signs and symptoms of kidney injury like trouble passing urine or change in the amount of urine signs and symptoms of liver injury like dark urine, light-colored stools, loss of appetite, nausea, right upper belly pain, yellowing of the eyes or skin sweating swollen lymph nodes weight loss Side effects that usually do not require medical attention (report to yourdoctor or health care professional if   they continue or are bothersome): decreased appetite hair loss tiredness This list may not describe all possible side effects. Call your doctor for medical advice about side effects. You may report side effects to FDA at1-800-FDA-1088. Where should I keep my medication? This drug is given in a hospital or clinic and will not be stored at home. NOTE: This sheet is a summary. It may not cover all possible information. If you  have questions about this medicine, talk to your doctor, pharmacist, orhealth care provider.  2022 Elsevier/Gold Standard (2019-10-12 21:44:53)  

## 2021-07-08 NOTE — Progress Notes (Unsigned)
Patient presented letter requesting records be faxed to his oncologist in [place]. His 1st appointment there is 08/06/21 and wants records there prior.  Dr. Cathie Beams, MD [address] [place] [address] [facility] Phone [phone] Fax # [phone] Emailed request to [email or web address]. Copy of request will be scanned.

## 2021-07-09 ENCOUNTER — Encounter: Payer: Self-pay | Admitting: Oncology

## 2021-07-10 ENCOUNTER — Ambulatory Visit: Payer: 59 | Admitting: Oncology

## 2021-07-10 ENCOUNTER — Ambulatory Visit: Payer: 59

## 2021-07-10 ENCOUNTER — Other Ambulatory Visit: Payer: 59

## 2021-07-19 ENCOUNTER — Encounter: Payer: Self-pay | Admitting: Oncology

## 2021-07-20 ENCOUNTER — Encounter: Payer: Self-pay | Admitting: Oncology

## 2021-08-27 ENCOUNTER — Encounter: Payer: Self-pay | Admitting: Oncology

## 2022-01-30 ENCOUNTER — Encounter: Payer: Self-pay | Admitting: Oncology

## 2022-02-05 ENCOUNTER — Other Ambulatory Visit: Payer: Self-pay

## 2022-02-10 ENCOUNTER — Encounter: Payer: Self-pay | Admitting: Oncology

## 2022-02-23 ENCOUNTER — Other Ambulatory Visit: Payer: Self-pay | Admitting: Oncology

## 2022-02-24 ENCOUNTER — Inpatient Hospital Stay: Payer: Medicare Other | Attending: Oncology | Admitting: Oncology

## 2022-02-24 ENCOUNTER — Encounter: Payer: Self-pay | Admitting: Oncology

## 2022-02-24 VITALS — BP 141/75 | HR 76 | Temp 97.8°F | Resp 18 | Ht 66.0 in | Wt 204.8 lb

## 2022-02-24 DIAGNOSIS — M1611 Unilateral primary osteoarthritis, right hip: Secondary | ICD-10-CM | POA: Diagnosis not present

## 2022-02-24 DIAGNOSIS — C7801 Secondary malignant neoplasm of right lung: Secondary | ICD-10-CM | POA: Diagnosis present

## 2022-02-24 DIAGNOSIS — I10 Essential (primary) hypertension: Secondary | ICD-10-CM | POA: Diagnosis not present

## 2022-02-24 DIAGNOSIS — C7802 Secondary malignant neoplasm of left lung: Secondary | ICD-10-CM | POA: Diagnosis present

## 2022-02-24 DIAGNOSIS — R059 Cough, unspecified: Secondary | ICD-10-CM | POA: Insufficient documentation

## 2022-02-24 DIAGNOSIS — C4362 Malignant melanoma of left upper limb, including shoulder: Secondary | ICD-10-CM | POA: Insufficient documentation

## 2022-02-24 DIAGNOSIS — Z96641 Presence of right artificial hip joint: Secondary | ICD-10-CM | POA: Insufficient documentation

## 2022-02-24 NOTE — Progress Notes (Signed)
?[facility] ?OFFICE PROGRESS NOTE ? ? ?Diagnosis: Malignant melanoma ? ?INTERVAL HISTORY:  ? ?Mr. Diamond return to [place] 3 to 4 days ago.  He was last seen here in August 2022.  He completed a course of pembrolizumab in [place], last dose given in January 2023. ?He underwent shave biopsy of an in situ melanoma 12/11/2021 with a positive margin.  He underwent a wide local excision of the left medial back biopsy site in February 2023. ? ?He underwent restaging CTs 12/09/2021.  Multiple new lung nodules were seen.  A CT chest super D on 12/25/2021 revealed numerous smooth round bilateral pulmonary nodules consistent with metastatic disease.  The nodules are new compared to September 2022. ? ?He went a vacation bronchoscopy and biopsy of lung nodules on 12/25/2021.  Pathology revealed from a right lower lobe transbronchial biopsy melanoma.  Pathology from the right lower lobe FNA, right lower lobe bronchial lavage also revealed malignant cells.  A level 11 R lymph node biopsy was negative for malignant cells. ? ?Staging CAT scan on 01/20/2022 revealed subcentimeter bilateral pulmonary nodules with low SUV activity no other evidence of metastatic disease.  A brain MRI 01/09/2022 was negative for metastatic disease. ? ?Mr. Honor Junes began treatment with ipilimumab and nivolumab on 01/29/2022.  He developed a diffuse rash within 2 days of treatment.  He took prednisone at a dose of 60 mg for 1 day then 20 mg daily for 7 days, then 10 mg daily.  The rash resolved after 2 days.  No diarrhea.  He reports increased urinary frequency since beginning prednisone.  The mouth has been sore without discrete ulcers. ? ?He otherwise feels well.  He underwent removal of a squamous cell carcinoma at the anterior chest on 02/19/2022.  He completed cycle 2 ipilimumab/nivolumab on 02/19/2022. ? ?Good appetite.  Stable chronic swelling of the left lower leg. ? ? ?Objective: ? ?Vital signs in last 24 hours: ? ?Blood pressure (!)  141/75, pulse 76, temperature 97.8 ?F (36.6 ?C), temperature source Oral, resp. rate 18, height '5\' 6"'  (1.676 m), weight 204 lb 12.8 oz (92.9 kg), SpO2 100 %. ?  ? ?HEENT: 1-2 mm ulcers at the posterior right buccal mucosa, no thrush ?Lymphatics: No cervical, supraclavicular, axillary, or inguinal nodes ?Resp: Lungs clear bilaterally ?Cardio: Regular rate and rhythm ?GI: No hepatosplenomegaly ?Vascular: Trace edema at the left lower leg, no erythema or tenderness  ?Skin: Healing biopsy site at the anterior chest, healed incision at the left back, no evidence for recurrent tumor at the left arm surgical site ?  ?Lab Results: ? ?Lab Results  ?Component Value Date  ? WBC 6.3 07/08/2021  ? HGB 13.6 07/08/2021  ? HCT 40.4 07/08/2021  ? MCV 97.6 07/08/2021  ? PLT 238 07/08/2021  ? NEUTROABS 3.4 07/08/2021  ? ? ?CMP  ?Lab Results  ?Component Value Date  ? NA 138 07/08/2021  ? K 3.9 07/08/2021  ? CL 104 07/08/2021  ? CO2 25 07/08/2021  ? GLUCOSE 89 07/08/2021  ? BUN 19 07/08/2021  ? CREATININE 1.00 07/08/2021  ? CALCIUM 8.8 (L) 07/08/2021  ? PROT 6.7 07/08/2021  ? ALBUMIN 4.2 07/08/2021  ? AST 20 07/08/2021  ? ALT 26 07/08/2021  ? ALKPHOS 91 07/08/2021  ? BILITOT 0.7 07/08/2021  ? GFRNONAA >60 07/08/2021  ? ? ?No results found for: CEA1, CEA, [id number], CA125 ? ?No results found for: INR, LABPROT ? ?Imaging: ? ?No results found. ? ?Medications: I have reviewed the patient's current medications. ? ? ?  Assessment/Plan: ?Stage IIIC melanoma (pT4apN1a), status post a radical excision of the left forearm melanoma, left axillary sentinel lymph node mapping/biopsy, and full thickness skin graft at the resection site 01/31/2021 ?Biopsy of left arm melanoma 12/28/2020-clinical stage IIb (cT3bcN0cM0), ulceration present, mitotic rate of 2/mm2 ?CTs chest, abdomen, and pelvis 01/31/2021-no evidence of metastatic disease ?01/31/2021- melanoma reexcision: Breslow depth 4.24 mm, Clark level V, 6 mitoses per millimeter squared, ulceration  absent,pT4a, 1/6 lymph nodes with a 5 mm deposit of metastatic melanoma,pN1a, BRAF mutation not detected ?Cycle 1 pembrolizumab 03/27/2021 ?Cycle 2 pembrolizumab 04/17/2021 ?Cycle 3 pembrolizumab 05/08/2021 ?Cycle 4 Pembrolizumab 05/29/2021 ?Cycle 5 pembrolizumab 06/17/2021 ?Cycle 6 pembrolizumab 07/08/2021 (changed to 6-week dosing) ?Last dose pembrolizumab January 2023 while living in [place] ?Restaging CTs 12/09/2021-new lung nodules ?12/25/2021-CT chest super D-numerous smooth round bilateral pulmonary nodules consistent with metastatic disease, new from September 2022 ?Navigate bronchoscopy 12/25/2021-biopsy of right lower lobe nodule-melanoma, right lower lobe FNA-malignant cells, right lower lobe bronchial lavage-malignant cells, 11 R lymph node FNA-negative for malignant cells ?Brain MRI 01/09/2022-negative for metastatic disease ?PET 01/20/2022-low-level SUV activity associated with small lung nodules, no other evidence of metastatic disease ?Cycle 1 ipilimumab/nivolumab 01/29/2022 ?Cycle 2 ipilimumab/nivolumab 02/19/2022 ?History of hepatitis B ?Hypertension ?Right hip osteoarthritis ?5.   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   Right hip replacement surgery 05/02/2021 ?7.   Left lower leg swelling 06/17/2021-negative Doppler 06/17/2021 ?8.   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  ? ? ?Disposition: ?Mr. Honor Junes has been diagnosed with metastatic melanoma.  He has completed 2 treatments with ipilimumab/nivolumab.  He tolerated the treatment well aside from a rash following cycle 1 ipilimumab/nivolumab.  The rash responded to prednisone.  He continues maintenance prednisone at a dose of 10 mg daily. ? ?Mr. Honor Junes will return for cycle 3 ipilimumab/nivolumab on 03/12/2022.  We will wean the prednisone dose as tolerated beginning on 03/12/2022. ? ?He will be referred for restaging CTs after cycle 4 ipilimumab/nivolumab. ? ?An immunotherapy plan was entered  today. ? ?Betsy Coder, MD ? ?02/24/2022  ?4:38 PM ? ? ?

## 2022-02-25 ENCOUNTER — Ambulatory Visit
Admission: RE | Admit: 2022-02-25 | Discharge: 2022-02-25 | Disposition: A | Discharge status: Home / Self Care | Payer: Self-pay | Source: Ambulatory Visit | Attending: Oncology | Admitting: Oncology

## 2022-02-25 ENCOUNTER — Inpatient Hospital Stay: Payer: Medicare Other | Admitting: Oncology

## 2022-02-25 ENCOUNTER — Other Ambulatory Visit: Payer: Self-pay | Admitting: *Deleted

## 2022-02-25 ENCOUNTER — Encounter: Payer: Self-pay | Admitting: *Deleted

## 2022-02-25 DIAGNOSIS — C4362 Malignant melanoma of left upper limb, including shoulder: Secondary | ICD-10-CM

## 2022-02-25 NOTE — Progress Notes (Signed)
Request faxed to [facility] [phone] for Images to be pushed in [facility] for CT Chest/Abdomen/Pelvis 10/23/21 ?CT Chest 12/25/21 ?PET 01/20/22 ? ?

## 2022-02-25 NOTE — Progress Notes (Signed)
DISCONTINUE ON PATHWAY REGIMEN - Melanoma and Other Skin Cancers ? ? ?  A cycle is every 21 days: ?    Pembrolizumab  ? ?**Always confirm dose/schedule in your pharmacy ordering system** ? ?REASON: Disease Progression ?PRIOR TREATMENT: [id number]: Pembrolizumab 200 mg  q21 Days for up to 1 Year of Therapy ?TREATMENT RESPONSE: Progressive Disease (PD) ? ?START ON PATHWAY REGIMEN - Melanoma and Other Skin Cancers ? ? ?  Cycles 1 through 4: A cycle is every 21 days: ?    Nivolumab  ?    Ipilimumab  ?  Cycles 5 and beyond: A cycle is every 28 days: ?    Nivolumab  ? ?**Always confirm dose/schedule in your pharmacy ordering system** ? ?Patient Characteristics: ?Melanoma, Cutaneous/Unknown Primary, Distant Metastases, Unresectable, No Brain Metastases, First Line, BRAF V600 Wild Type / BRAF V600 Results Pending or Unknown, Candidate for Immunotherapy ?Disease Classification: Melanoma ?Disease Subtype: Cutaneous ?BRAF V600 Mutation Status: BRAF V600 Wild Type (No Mutation) ?Therapeutic Status: Distant Metastases ?Line of Therapy: First Line ?Immunotherapy Candidate Status: Candidate for Immunotherapy ?Intent of Therapy: ?Non-Curative / Palliative Intent, Discussed with Patient ?

## 2022-03-07 ENCOUNTER — Other Ambulatory Visit: Payer: Self-pay | Admitting: Oncology

## 2022-03-11 ENCOUNTER — Encounter: Payer: Self-pay | Admitting: Oncology

## 2022-03-14 ENCOUNTER — Inpatient Hospital Stay: Payer: Medicare Other

## 2022-03-14 ENCOUNTER — Ambulatory Visit (HOSPITAL_BASED_OUTPATIENT_CLINIC_OR_DEPARTMENT_OTHER)
Admission: RE | Admit: 2022-03-14 | Discharge: 2022-03-14 | Disposition: A | Discharge status: Home / Self Care | Payer: Medicare Other | Source: Ambulatory Visit | Attending: Oncology | Admitting: Oncology

## 2022-03-14 ENCOUNTER — Telehealth: Payer: Self-pay

## 2022-03-14 ENCOUNTER — Encounter: Payer: Self-pay | Admitting: *Deleted

## 2022-03-14 ENCOUNTER — Inpatient Hospital Stay: Payer: Medicare Other | Admitting: Oncology

## 2022-03-14 ENCOUNTER — Other Ambulatory Visit: Payer: Self-pay | Admitting: *Deleted

## 2022-03-14 VITALS — BP 122/73 | HR 100 | Temp 98.1°F | Resp 18 | Ht 66.0 in | Wt 197.4 lb

## 2022-03-14 DIAGNOSIS — C4362 Malignant melanoma of left upper limb, including shoulder: Secondary | ICD-10-CM

## 2022-03-14 LAB — CMP (CANCER CENTER ONLY)
ALT: 83 U/L — ABNORMAL HIGH (ref 0–44)
AST: 72 U/L — ABNORMAL HIGH (ref 15–41)
Albumin: 3.7 g/dL (ref 3.5–5.0)
Alkaline Phosphatase: 101 U/L (ref 38–126)
Anion gap: 10 (ref 5–15)
BUN: 29 mg/dL — ABNORMAL HIGH (ref 8–23)
CO2: 25 mmol/L (ref 22–32)
Calcium: 9 mg/dL (ref 8.9–10.3)
Chloride: 102 mmol/L (ref 98–111)
Creatinine: 1.23 mg/dL (ref 0.61–1.24)
GFR, Estimated: >60 mL/min (ref >60)
Glucose, Bld: 111 mg/dL — ABNORMAL HIGH (ref 70–99)
Potassium: 3.5 mmol/L (ref 3.5–5.1)
Sodium: 137 mmol/L (ref 135–145)
Total Bilirubin: 0.6 mg/dL (ref 0.3–1.2)
Total Protein: 6.8 g/dL (ref 6.5–8.1)

## 2022-03-14 LAB — CBC WITH DIFFERENTIAL (CANCER CENTER ONLY)
Abs Immature Granulocytes: 0.02 10*3/uL (ref 0.00–0.07)
Basophils Absolute: 0 10*3/uL (ref 0.0–0.1)
Basophils Relative: 0 %
Eosinophils Absolute: 0.1 10*3/uL (ref 0.0–0.5)
Eosinophils Relative: 1 %
HCT: 40.8 % (ref 39.0–52.0)
Hemoglobin: 13.8 g/dL (ref 13.0–17.0)
Immature Granulocytes: 0 %
Lymphocytes Relative: 22 %
Lymphs Abs: 1.3 10*3/uL (ref 0.7–4.0)
MCH: 31.7 pg (ref 26.0–34.0)
MCHC: 33.8 g/dL (ref 30.0–36.0)
MCV: 93.8 fL (ref 80.0–100.0)
Monocytes Absolute: 0.2 10*3/uL (ref 0.1–1.0)
Monocytes Relative: 4 %
Neutro Abs: 4.1 10*3/uL (ref 1.7–7.7)
Neutrophils Relative %: 73 %
Platelet Count: 169 10*3/uL (ref 150–400)
RBC: 4.35 MIL/uL (ref 4.22–5.81)
RDW: 13.4 % (ref 11.5–15.5)
WBC Count: 5.7 10*3/uL (ref 4.0–10.5)
nRBC: 0 % (ref 0.0–0.2)

## 2022-03-14 LAB — TSH: TSH: 1.293 u[IU]/mL (ref 0.350–4.500)

## 2022-03-14 NOTE — Progress Notes (Signed)
Patient seen by Dr. Benay Spice today ? ?Vitals are within treatment parameters. ? ?Labs reviewed by Dr. Benay Spice  AST and ALT higher today.  ? ?Per physician team, patient will not be receiving treatment today.  ?Patient not feeling well today, will hold treatment and reschedule for next week. ?

## 2022-03-14 NOTE — Progress Notes (Signed)
?[facility] ?OFFICE PROGRESS NOTE ? ? ?Diagnosis: Melanoma ? ?INTERVAL HISTORY:  ? ?[name] returns as scheduled.  He continues prednisone at a dose of 10 mg daily.  No rash. ?He returned 03/08/2022 after a trip to [facility].  He noted the onset of "sneezing ", bloody nasal discharge, and "chills "beginning 03/08/2022.  The chills worsened on 03/11/2022.  He had a maximum temperature of 99.8 degrees over the past several days.  Mild cough.  He has increased upper airway congestion when supine.  No sore throat. ?He had a negative home COVID test. ?Objective: ? ?Vital signs in last 24 hours: ? ?Blood pressure 122/73, pulse 100, temperature 98.1 ?F (36.7 ?C), temperature source Oral, resp. rate 18, height '5\' 6"'  (1.676 m), weight 197 lb 6.4 oz (89.5 kg), SpO2 100 %. ?  ? ?HEENT: Oropharynx without erythema or exudate ?Resp: Lungs clear bilaterally in the anterior and posterior lung fields, no respiratory distress ?Cardio: Regular rate and rhythm ?GI: No hepatosplenomegaly ?Vascular: No leg edema  ?Skin: No rash ? ?Lab Results: ? ?Lab Results  ?Component Value Date  ? WBC 5.7 03/14/2022  ? HGB 13.8 03/14/2022  ? HCT 40.8 03/14/2022  ? MCV 93.8 03/14/2022  ? PLT 169 03/14/2022  ? NEUTROABS 4.1 03/14/2022  ? ? ?CMP  ?Lab Results  ?Component Value Date  ? NA 137 03/14/2022  ? K 3.5 03/14/2022  ? CL 102 03/14/2022  ? CO2 25 03/14/2022  ? GLUCOSE 111 (H) 03/14/2022  ? BUN 29 (H) 03/14/2022  ? CREATININE 1.23 03/14/2022  ? CALCIUM 9.0 03/14/2022  ? PROT 6.8 03/14/2022  ? ALBUMIN 3.7 03/14/2022  ? AST 72 (H) 03/14/2022  ? ALT 83 (H) 03/14/2022  ? ALKPHOS 101 03/14/2022  ? BILITOT 0.6 03/14/2022  ? GFRNONAA >60 03/14/2022  ? ? ?Medications: I have reviewed the patient's current medications. ? ? ?Assessment/Plan: ?Stage IIIC melanoma (pT4apN1a), status post a radical excision of the left forearm melanoma, left axillary sentinel lymph node mapping/biopsy, and full thickness skin graft at the resection site  01/31/2021 ?Biopsy of left arm melanoma 12/28/2020-clinical stage IIb (cT3bcN0cM0), ulceration present, mitotic rate of 2/mm2 ?CTs chest, abdomen, and pelvis 01/31/2021-no evidence of metastatic disease ?01/31/2021- melanoma reexcision: Breslow depth 4.24 mm, Clark level V, 6 mitoses per millimeter squared, ulceration absent,pT4a, 1/6 lymph nodes with a 5 mm deposit of metastatic melanoma,pN1a, BRAF mutation not detected ?Cycle 1 pembrolizumab 03/27/2021 ?Cycle 2 pembrolizumab 04/17/2021 ?Cycle 3 pembrolizumab 05/08/2021 ?Cycle 4 Pembrolizumab 05/29/2021 ?Cycle 5 pembrolizumab 06/17/2021 ?Cycle 6 pembrolizumab 07/08/2021 (changed to 6-week dosing) ?Last dose pembrolizumab January 2023 while living in [place] ?Restaging CTs 12/09/2021-new lung nodules ?12/25/2021-CT chest super D-numerous smooth round bilateral pulmonary nodules consistent with metastatic disease, new from September 2022 ?Navigate bronchoscopy 12/25/2021-biopsy of right lower lobe nodule-melanoma, right lower lobe FNA-malignant cells, right lower lobe bronchial lavage-malignant cells, 11 R lymph node FNA-negative for malignant cells ?Brain MRI 01/09/2022-negative for metastatic disease ?PET 01/20/2022-low-level SUV activity associated with small lung nodules, no other evidence of metastatic disease ?Cycle 1 ipilimumab/nivolumab 01/29/2022 ?Cycle 2 ipilimumab/nivolumab 02/19/2022 ?History of hepatitis B ?Hypertension ?Right hip osteoarthritis ?5.   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   Right hip replacement surgery 05/02/2021 ?7.   Left lower leg swelling 06/17/2021-negative Doppler 06/17/2021 ?8.   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  ? ? ? ?Disposition: ?[name] has metastatic melanoma.  He is scheduled for cycle 3  ipilimumab/nivolumab today.  He has developed upper respiratory congestion with sinus/nasal drainage and chills over the past week.  I suspect he has a viral infection.  He reports a  home COVID-19 test was negative yesterday.  I recommend he test again for COVID-19.  We will obtain an x-ray to look for evidence of pneumonia.  He will go to the emergency room for a high fever or shortness of breath. ?Cycle 3 ipilimumab/nivolumab will be delayed for 1 week.  He will return for an office visit 03/21/2022.  He will taper prednisone to 5 mg daily. ? ? ? ?Betsy Coder, MD ? ?03/14/2022  ?10:11 AM ? ? ?

## 2022-03-14 NOTE — Telephone Encounter (Signed)
Error

## 2022-03-15 ENCOUNTER — Other Ambulatory Visit: Payer: Self-pay | Admitting: Oncology

## 2022-03-20 NOTE — Progress Notes (Signed)
Pharmacist Chemotherapy Monitoring - Initial Assessment   ? ?Anticipated start date: 03/21/22  ? ?The following has been reviewed per standard work regarding the patient's treatment regimen: ?The patient's diagnosis, treatment plan and drug doses, and organ/hematologic function ?Lab orders and baseline tests specific to treatment regimen  ?The treatment plan start date, drug sequencing, and pre-medications ?Prior authorization status  ?Patient's documented medication list, including drug-drug interaction screen and prescriptions for anti-emetics and supportive care specific to the treatment regimen ?The drug concentrations, fluid compatibility, administration routes, and timing of the medications to be used ?The patient's access for treatment and lifetime cumulative dose history, if applicable  ?The patient's medication allergies and previous infusion related reactions, if applicable  ? ?Changes made to treatment plan:  ?N/A ? ?Follow up needed:  ?N/A ? ? ?Steven Morrow, RPH, ?03/20/2022  11:14 AM  ?

## 2022-03-21 ENCOUNTER — Inpatient Hospital Stay: Payer: Medicare Other | Admitting: Oncology

## 2022-03-21 ENCOUNTER — Other Ambulatory Visit: Payer: Self-pay

## 2022-03-21 ENCOUNTER — Encounter: Payer: Self-pay | Admitting: *Deleted

## 2022-03-21 ENCOUNTER — Ambulatory Visit (HOSPITAL_BASED_OUTPATIENT_CLINIC_OR_DEPARTMENT_OTHER)
Admission: RE | Admit: 2022-03-21 | Discharge: 2022-03-21 | Disposition: A | Discharge status: Home / Self Care | Payer: Medicare Other | Source: Ambulatory Visit | Attending: Oncology | Admitting: Oncology

## 2022-03-21 ENCOUNTER — Telehealth: Payer: Self-pay

## 2022-03-21 ENCOUNTER — Inpatient Hospital Stay: Payer: Medicare Other

## 2022-03-21 VITALS — BP 109/70 | HR 60 | Temp 98.1°F | Resp 20 | Ht 66.0 in | Wt 193.6 lb

## 2022-03-21 DIAGNOSIS — C4362 Malignant melanoma of left upper limb, including shoulder: Secondary | ICD-10-CM

## 2022-03-21 LAB — CBC WITH DIFFERENTIAL (CANCER CENTER ONLY)
Abs Immature Granulocytes: 0.02 10*3/uL (ref 0.00–0.07)
Basophils Absolute: 0 10*3/uL (ref 0.0–0.1)
Basophils Relative: 0 %
Eosinophils Absolute: 0.1 10*3/uL (ref 0.0–0.5)
Eosinophils Relative: 2 %
HCT: 40.6 % (ref 39.0–52.0)
Hemoglobin: 13.5 g/dL (ref 13.0–17.0)
Immature Granulocytes: 0 %
Lymphocytes Relative: 27 %
Lymphs Abs: 1.8 10*3/uL (ref 0.7–4.0)
MCH: 31.5 pg (ref 26.0–34.0)
MCHC: 33.3 g/dL (ref 30.0–36.0)
MCV: 94.6 fL (ref 80.0–100.0)
Monocytes Absolute: 0.8 10*3/uL (ref 0.1–1.0)
Monocytes Relative: 12 %
Neutro Abs: 3.9 10*3/uL (ref 1.7–7.7)
Neutrophils Relative %: 59 %
Platelet Count: 233 10*3/uL (ref 150–400)
RBC: 4.29 MIL/uL (ref 4.22–5.81)
RDW: 13.6 % (ref 11.5–15.5)
WBC Count: 6.7 10*3/uL (ref 4.0–10.5)
nRBC: 0 % (ref 0.0–0.2)

## 2022-03-21 LAB — CMP (CANCER CENTER ONLY)
ALT: 91 U/L — ABNORMAL HIGH (ref 0–44)
AST: 52 U/L — ABNORMAL HIGH (ref 15–41)
Albumin: 3.8 g/dL (ref 3.5–5.0)
Alkaline Phosphatase: 89 U/L (ref 38–126)
Anion gap: 9 (ref 5–15)
BUN: 18 mg/dL (ref 8–23)
CO2: 24 mmol/L (ref 22–32)
Calcium: 9.6 mg/dL (ref 8.9–10.3)
Chloride: 104 mmol/L (ref 98–111)
Creatinine: 1.13 mg/dL (ref 0.61–1.24)
GFR, Estimated: >60 mL/min (ref >60)
Glucose, Bld: 107 mg/dL — ABNORMAL HIGH (ref 70–99)
Potassium: 3.6 mmol/L (ref 3.5–5.1)
Sodium: 137 mmol/L (ref 135–145)
Total Bilirubin: 0.7 mg/dL (ref 0.3–1.2)
Total Protein: 7.3 g/dL (ref 6.5–8.1)

## 2022-03-21 MED ORDER — BENZONATATE 100 MG PO CAPS: 100.0000 mg | ORAL_CAPSULE | Freq: Three times a day (TID) | ORAL | 0 refills | Status: DC | PRN

## 2022-03-21 MED ORDER — IOHEXOL 300 MG/ML  SOLN
100.0000 mL | Freq: Once | INTRAMUSCULAR | Status: AC | PRN
  Administered 2022-03-21: 75 mL via INTRAVENOUS

## 2022-03-21 NOTE — Progress Notes (Signed)
Patient seen by Dr. Benay Spice today ? ?Vitals are within treatment parameters. ? ?Labs reviewed by Dr. Benay Spice and are not all within treatment parameters. ALT 91 (stable)--OK to treat ? ?Per physician team, patient will not be receiving treatment today.  ?

## 2022-03-21 NOTE — Telephone Encounter (Signed)
V/m message left. Pt informed to return call if any questions or concerns. Prescription for tessalon perles called in to pharmacy. ?

## 2022-03-21 NOTE — Telephone Encounter (Signed)
-----   Message from Ladell Pier, MD sent at 03/21/2022  4:43 PM EDT ----- ?Please call patient, CT shows no evidence of pneumonia, pneumonitis, only 1 tiny lung nodule, follow-up as scheduled with plan to resume treatment next week ? ?

## 2022-03-21 NOTE — Progress Notes (Signed)
?[facility] ?OFFICE PROGRESS NOTE ? ? ?Diagnosis: Melanoma ? ?INTERVAL HISTORY:  ? ?Steven Morrow returns as scheduled.  He reports feeling much better compared to when I saw him last week.  No further chills.  Sinus drainage has improved.  He continues to have a cough, chiefly at night.  He has episodes of severe coughing during the night.  No dyspnea.  He reports a nonproductive cough began prior to when he left [place] last month.  He is now taking prednisone at a dose of 5 mg daily.  He had a second negative COVID test last week.  A chest x-ray 03/14/2022 revealed no acute change. ? ?Objective: ? ?Vital signs in last 24 hours: ? ?Blood pressure 109/70, pulse 60, temperature 98.1 ?F (36.7 ?C), temperature source Oral, resp. rate 20, height _0  (1.676 m), weight 193 lb 9.6 oz (87.8 kg), SpO2 100 %. ?  ? ?Resp: Lungs clear bilaterally ?Cardio: Regular rate and rhythm ?GI: Nontender, no hepatosplenomegaly ?Vascular: No leg edema, left lower leg is larger than the right side ? ? ?Lab Results: ? ?Lab Results  ?Component Value Date  ? WBC 6.7 03/21/2022  ? HGB 13.5 03/21/2022  ? HCT 40.6 03/21/2022  ? MCV 94.6 03/21/2022  ? PLT 233 03/21/2022  ? NEUTROABS 3.9 03/21/2022  ? ? ?CMP  ?Lab Results  ?Component Value Date  ? NA 137 03/21/2022  ? K 3.6 03/21/2022  ? CL 104 03/21/2022  ? CO2 24 03/21/2022  ? GLUCOSE 107 (H) 03/21/2022  ? BUN 18 03/21/2022  ? CREATININE 1.13 03/21/2022  ? CALCIUM 9.6 03/21/2022  ? PROT 7.3 03/21/2022  ? ALBUMIN 3.8 03/21/2022  ? AST 52 (H) 03/21/2022  ? ALT 91 (H) 03/21/2022  ? ALKPHOS 89 03/21/2022  ? BILITOT 0.7 03/21/2022  ? GFRNONAA >60 03/21/2022  ? ? ?Medications: I have reviewed the patient's current medications. ? ? ?Assessment/Plan: ?Stage IIIC melanoma (pT4apN1a), status post a radical excision of the left forearm melanoma, left axillary sentinel lymph node mapping/biopsy, and full thickness skin graft at the resection site 01/31/2021 ?Biopsy of left arm melanoma  12/28/2020-clinical stage IIb ([id number]), ulceration present, mitotic rate of 2/mm2 ?CTs chest, abdomen, and pelvis 01/31/2021-no evidence of metastatic disease ?01/31/2021- melanoma reexcision: Breslow depth 4.24 mm, Clark level V, 6 mitoses per millimeter squared, ulceration absent,pT4a, 1/6 lymph nodes with a 5 mm deposit of metastatic melanoma,pN1a, BRAF mutation not detected ?Cycle 1 pembrolizumab 03/27/2021 ?Cycle 2 pembrolizumab 04/17/2021 ?Cycle 3 pembrolizumab 05/08/2021 ?Cycle 4 Pembrolizumab 05/29/2021 ?Cycle 5 pembrolizumab 06/17/2021 ?Cycle 6 pembrolizumab 07/08/2021 (changed to 6-week dosing) ?Last dose pembrolizumab January 2023 while living in [place] ?Restaging CTs 12/09/2021-new lung nodules ?12/25/2021-CT chest super D-numerous smooth round bilateral pulmonary nodules consistent with metastatic disease, new from September 2022 ?Navigate bronchoscopy 12/25/2021-biopsy of right lower lobe nodule-melanoma, right lower lobe FNA-malignant cells, right lower lobe bronchial lavage-malignant cells, 11 R lymph node FNA-negative for malignant cells ?Brain MRI 01/09/2022-negative for metastatic disease ?PET 01/20/2022-low-level SUV activity associated with small lung nodules, no other evidence of metastatic disease ?Cycle 1 ipilimumab/nivolumab 01/29/2022 ?Cycle 2 ipilimumab/nivolumab 02/19/2022 ?History of hepatitis B ?Hypertension ?Right hip osteoarthritis ?5.   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   Right hip replacement surgery 05/02/2021 ?7.   Left lower leg swelling 06/17/2021-negative Doppler 06/17/2021 ?8.   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  ? ? ? ?Disposition: ?Steven Morrow has a history of metastatic melanoma.  He has completed 2 cycles of salvage therapy with ipilimumab/nivolumab.  He presented last week with symptoms of an upper respiratory infection.  A chest x-ray revealed no acute change.  He has persistent cough.  He reports the cough is  nonproductive and is worse at night.  The cough has been present for the past month. ? ?The cough may be related to a recent upper respiratory infection, pneumonitis from treatment, or progressive disease.  I discussed the differential diagnosis with Steven Morrow and his wife.  We decided to hold treatment today.  He will be referred for a CT chest.  He will return for an office visit with the plan to resume ipilimumab/nivolumab on 03/25/2022. ? ?Betsy Coder, MD ? ?03/21/2022  ?9:29 AM ? ? ?

## 2022-03-24 ENCOUNTER — Encounter: Payer: Self-pay | Admitting: Nurse Practitioner

## 2022-03-25 ENCOUNTER — Encounter: Payer: Self-pay | Admitting: *Deleted

## 2022-03-25 ENCOUNTER — Inpatient Hospital Stay: Payer: Medicare Other

## 2022-03-25 ENCOUNTER — Inpatient Hospital Stay: Payer: Medicare Other | Attending: Oncology | Admitting: Oncology

## 2022-03-25 VITALS — BP 120/73 | HR 98 | Temp 98.1°F | Resp 18 | Ht 66.0 in | Wt 193.2 lb

## 2022-03-25 DIAGNOSIS — I10 Essential (primary) hypertension: Secondary | ICD-10-CM | POA: Diagnosis not present

## 2022-03-25 DIAGNOSIS — C7802 Secondary malignant neoplasm of left lung: Secondary | ICD-10-CM | POA: Diagnosis not present

## 2022-03-25 DIAGNOSIS — C4362 Malignant melanoma of left upper limb, including shoulder: Secondary | ICD-10-CM

## 2022-03-25 DIAGNOSIS — Z5112 Encounter for antineoplastic immunotherapy: Secondary | ICD-10-CM | POA: Diagnosis present

## 2022-03-25 DIAGNOSIS — C7801 Secondary malignant neoplasm of right lung: Secondary | ICD-10-CM | POA: Insufficient documentation

## 2022-03-25 MED ORDER — SODIUM CHLORIDE 0.9 % IV SOLN
3.0000 mg/kg | Freq: Once | INTRAVENOUS | Status: AC
  Administered 2022-03-25: 300 mg via INTRAVENOUS
  Filled 2022-03-25: qty 40

## 2022-03-25 MED ORDER — DIPHENHYDRAMINE HCL 50 MG/ML IJ SOLN
25.0000 mg | Freq: Once | INTRAMUSCULAR | Status: AC
  Administered 2022-03-25: 25 mg via INTRAVENOUS
  Filled 2022-03-25: qty 1

## 2022-03-25 MED ORDER — FAMOTIDINE IN NACL 20-0.9 MG/50ML-% IV SOLN
20.0000 mg | Freq: Once | INTRAVENOUS | Status: AC
  Administered 2022-03-25: 20 mg via INTRAVENOUS
  Filled 2022-03-25: qty 50

## 2022-03-25 MED ORDER — SODIUM CHLORIDE 0.9 % IV SOLN
1.0000 mg/kg | Freq: Once | INTRAVENOUS | Status: AC
  Administered 2022-03-25: 93 mg via INTRAVENOUS
  Filled 2022-03-25: qty 9.3

## 2022-03-25 MED ORDER — SODIUM CHLORIDE 0.9 % IV SOLN: Freq: Once | INTRAVENOUS | Status: AC

## 2022-03-25 NOTE — Progress Notes (Signed)
Patient presents for treatment. RN assessment completed along with the following: ? ?Labs/vitals reviewed - Yes, and within treatment parameters.  Labs reviewed by Dr. Benay Spice  OK to treat based on labs collected on 03/21/22. ?Weight within 10% of previous measurement - Yes ?Informed consent completed and reflects current therapy/intent - Yes, on date 03/25/22             ?Provider progress note reviewed - Today's provider note is not yet available. I reviewed the most recent oncology provider progress note in chart dated 03/21/22. ?Treatment/Antibody/Supportive plan reviewed - Yes, and there are no adjustments needed for today's treatment. ?S&H and other orders reviewed - Yes, and there are no additional orders identified. ?Previous treatment date reviewed - Yes, and the appropriate amount of time has elapsed between treatments. ?Clinic Hand Off Received from - Cristy Friedlander, RN ? ?Patient to proceed with treatment.  ? ?

## 2022-03-25 NOTE — Patient Instructions (Signed)
[facility]   ?Discharge Instructions: ?Thank you for choosing [facility] to provide your oncology and hematology care.  ? ?If you have a lab appointment with the [facility], please go directly to the [facility] and check in at the registration area. ?  ?Wear comfortable clothing and clothing appropriate for easy access to any Portacath or PICC line.  ? ?We strive to give you quality time with your provider. You may need to reschedule your appointment if you arrive late (15 or more minutes).  Arriving late affects you and other patients whose appointments are after yours.  Also, if you miss three or more appointments without notifying the office, you may be dismissed from the clinic at the provider?s discretion.    ?  ?For prescription refill requests, have your pharmacy contact our office and allow 72 hours for refills to be completed.   ? ?Today you received the following chemotherapy and/or immunotherapy agents Opdivo, Yervoy    ?  ?To help prevent nausea and vomiting after your treatment, we encourage you to take your nausea medication as directed. ? ?BELOW ARE SYMPTOMS THAT SHOULD BE REPORTED IMMEDIATELY: ?*FEVER GREATER THAN 100.4 F (38 ?C) OR HIGHER ?*CHILLS OR SWEATING ?*NAUSEA AND VOMITING THAT IS NOT CONTROLLED WITH YOUR NAUSEA MEDICATION ?*UNUSUAL SHORTNESS OF BREATH ?*UNUSUAL BRUISING OR BLEEDING ?*URINARY PROBLEMS (pain or burning when urinating, or frequent urination) ?*BOWEL PROBLEMS (unusual diarrhea, constipation, pain near the anus) ?TENDERNESS IN MOUTH AND THROAT WITH OR WITHOUT PRESENCE OF ULCERS (sore throat, sores in mouth, or a toothache) ?UNUSUAL RASH, SWELLING OR PAIN  ?UNUSUAL VAGINAL DISCHARGE OR ITCHING  ? ?Items with * indicate a potential emergency and should be followed up as soon as possible or go to the Emergency Department if any problems should occur. ? ?Please show the CHEMOTHERAPY ALERT CARD or IMMUNOTHERAPY ALERT CARD at check-in  to the Emergency Department and triage nurse. ? ?Should you have questions after your visit or need to cancel or reschedule your appointment, please contact [facility]  Dept: [phone]  and follow the prompts.  Office hours are 8:00 a.m. to 4:30 p.m. Monday - Friday. Please note that voicemails left after 4:00 p.m. may not be returned until the following business day.  We are closed weekends and major holidays. You have access to a nurse at all times for urgent questions. Please call the main number to the clinic Dept: [phone] and follow the prompts. ? ? ?For any non-urgent questions, you may also contact your provider using MyChart. We now offer e-Visits for anyone 39 and older to request care online for non-urgent symptoms. For details visit [email or web address]. ?  ?Also download the MyChart app! Go to the app store, search "MyChart", open the app, select [facility], and log in with your MyChart username and password. ? ?Due to Covid, a mask is required upon entering the hospital/clinic. If you do not have a mask, one will be given to you upon arrival. For doctor visits, patients may have 1 support person aged 15 or older with them. For treatment visits, patients cannot have anyone with them due to current Covid guidelines and our immunocompromised population.  ? ?Nivolumab injection ?What is this medication? ?NIVOLUMAB (nye VOL ue mab) is a monoclonal antibody. It treats certain types of cancer. Some of the cancers treated are colon cancer, head and neck cancer, Hodgkin lymphoma, lung cancer, and melanoma. ?This medicine may be used for other purposes;  ask your health care provider or pharmacist if you have questions. ?COMMON BRAND NAME(S): Opdivo ?What should I tell my care team before I take this medication? ?They need to know if you have any of these conditions: ?Autoimmune diseases such as Crohn's disease, ulcerative colitis, or lupus ?Have had or planning to have an  allogeneic stem cell transplant (uses someone else's stem cells) ?History of chest radiation ?Organ transplant ?Nervous system problems such as myasthenia gravis or Guillain-Barre syndrome ?An unusual or allergic reaction to nivolumab, other medicines, foods, dyes, or preservatives ?Pregnant or trying to get pregnant ?Breast-feeding ?How should I use this medication? ?This medication is injected into a vein. It is given in a hospital or clinic setting. ?A special MedGuide will be given to you before each treatment. Be sure to read this information carefully each time. ?Talk to your care team regarding the use of this medication in children. While it may be prescribed for children as young as 12 years for selected conditions, precautions do apply. ?Overdosage: If you think you have taken too much of this medicine contact a poison control center or emergency room at once. ?NOTE: This medicine is only for you. Do not share this medicine with others. ?What if I miss a dose? ?Keep appointments for follow-up doses. It is important not to miss your dose. Call your care team if you are unable to keep an appointment. ?What may interact with this medication? ?Interactions have not been studied. ?This list may not describe all possible interactions. Give your health care provider a list of all the medicines, herbs, non-prescription drugs, or dietary supplements you use. Also tell them if you smoke, drink alcohol, or use illegal drugs. Some items may interact with your medicine. ?What should I watch for while using this medication? ?Your condition will be monitored carefully while you are receiving this medication. ?You may need blood work done while you are taking this medication. ?Do not become pregnant while taking this medication or for 5 months after stopping it. Women should inform their care team if they wish to become pregnant or think they might be pregnant. There is a potential for serious harm to an unborn child.  Talk to your care team for more information. Do not breast-feed an infant while taking this medication or for 5 months after stopping it. ?What side effects may I notice from receiving this medication? ?Side effects that you should report to your care team as soon as possible: ?Allergic reactions--skin rash, itching, hives, swelling of the face, lips, tongue, or throat ?Bloody or black, tar-like stools ?Change in vision ?Chest pain ?Diarrhea ?Dry cough, shortness of breath or trouble breathing ?Eye pain ?Fast or irregular heartbeat ?Fever, chills ?High blood sugar (hyperglycemia)--increased thirst or amount of urine, unusual weakness or fatigue, blurry vision ?High thyroid levels (hyperthyroidism)--fast or irregular heartbeat, weight loss, excessive sweating or sensitivity to heat, tremors or shaking, anxiety, nervousness, irregular menstrual cycle or spotting ?Kidney injury--decrease in the amount of urine, swelling of the ankles, hands, or feet ?Liver injury--right upper belly pain, loss of appetite, nausea, light-colored stool, dark yellow or brown urine, yellowing skin or eyes, unusual weakness or fatigue ?Low red blood cell count--unusual weakness or fatigue, dizziness, headache, trouble breathing ?Low thyroid levels (hypothyroidism)--unusual weakness or fatigue, increased sensitivity to cold, constipation, hair loss, dry skin, weight gain, feelings of depression ?Mood and behavior changes-confusion, change in sex drive or performance, irritability ?Muscle pain or cramps ?Pain, tingling, or numbness in the hands or feet, muscle  weakness, trouble walking, loss of balance or coordination ?Red or dark brown urine ?Redness, blistering, peeling, or loosening of the skin, including inside the mouth ?Stomach pain ?Unusual bruising or bleeding ?Side effects that usually do not require medical attention (report to your care team if they continue or are bothersome): ?Bone pain ?Constipation ?Loss of  appetite ?Nausea ?Tiredness ?Vomiting ?This list may not describe all possible side effects. Call your doctor for medical advice about side effects. You may report side effects to FDA at 1-800-FDA-1088. ?Where should I keep m

## 2022-03-25 NOTE — Progress Notes (Signed)
Ipilimumab (YERVOY) Patient Monitoring Assessment  ? ?Is the patient experiencing any of the following general symptoms?:  ?'[]'$ Difficulty performing normal activities ?'[]'$ Feeling sluggish or cold all the time ?'[]'$ Unusual weight gain ?'[]'$ Constant or unusual headaches ?'[]'$ Feeling dizzy or faint ?'[]'$ Changes in eyesight (blurry vision, double vision, or other vision problems) ?'[]'$ Changes in mood or behavior (ex: decreased sex drive, irritability, or forgetfulness) ?'[]'$ Starting new medications (ex: steroids, other medications that lower immune response) ?[ x]Patient is not experiencing any of the general symptoms above.  ? ?Gastrointestinal  ?Patient is having 1-2 bowel movements each day.  ?Is this different from baseline? '[ ]'$ Yes [ x]No ?Are your stools watery or do they have a foul smell? '[ ]'$ Yes [ x]No ?Have you seen blood in your stools? '[ ]'$ Yes [ x]No ?Are your stools dark, tarry, or sticky? '[ ]'$ Yes [ x]No ?Are you having pain or tenderness in your belly? '[ ]'$ Yes [ x]No ? ?Skin ?Does your skin itch? '[ ]'$ Yes [x ]No ?Do you have a rash? '[ ]'$ Yes [x ]No ?Has your skin blistered and/or peeled? '[ ]'$ Yes [ xNo ?Do you have sores in your mouth? '[ ]'$ Yes [x ]No ? ?Hepatic ?Has your urine been dark or tea colored? '[ ]'$ Yes [x ]No ?Have you noticed that your skin or the whites of your eyes are turning yellow? '[ ]'$ Yes [ x]No ?Are you bleeding or bruising more easily than normal? '[ ]'$ Yes [x ]No ?Are you nauseous and/or vomiting? '[ ]'$ Yes [ x]No ?Do you have pain on the right side of your stomach? '[ ]'$ Yes [ x]No ? ?Neurologic  ?Are you having unusual weakness of legs, arms, or face? '[ ]'$ Yes [ x]No ?Are you having numbness or tingling in your hands or feet? '[ ]'$ Yes [ x]No ? ?Marylu Lund ? ? ?

## 2022-03-25 NOTE — Progress Notes (Signed)
?[facility] ?OFFICE PROGRESS NOTE ? ? ?Diagnosis: Melanoma ? ?INTERVAL HISTORY:  ? ?[name] returns as scheduled.  He continues to have a cough, but otherwise feels well.  The cough has improved.  No new complaint.  He is taking Delsym and Tessalon Perles as needed. ? ?Objective: ? ?Vital signs in last 24 hours: ? ?Blood pressure 120/73, pulse 98, temperature 98.1 ?F (36.7 ?C), resp. rate 18, height '5\' 6"'  (1.676 m), weight 193 lb 3.2 oz (87.6 kg), SpO2 100 %. ?  ? ?Resp: Lungs clear bilaterally ?Cardio: Regular rate and rhythm ?GI: Nontender, no hepatosplenomegaly ?Vascular: No leg edema, left lower leg is larger than the right side  ?Skin: No rash ?Lab Results: ? ?Lab Results  ?Component Value Date  ? WBC 6.7 03/21/2022  ? HGB 13.5 03/21/2022  ? HCT 40.6 03/21/2022  ? MCV 94.6 03/21/2022  ? PLT 233 03/21/2022  ? NEUTROABS 3.9 03/21/2022  ? ? ?CMP  ?Lab Results  ?Component Value Date  ? NA 137 03/21/2022  ? K 3.6 03/21/2022  ? CL 104 03/21/2022  ? CO2 24 03/21/2022  ? GLUCOSE 107 (H) 03/21/2022  ? BUN 18 03/21/2022  ? CREATININE 1.13 03/21/2022  ? CALCIUM 9.6 03/21/2022  ? PROT 7.3 03/21/2022  ? ALBUMIN 3.8 03/21/2022  ? AST 52 (H) 03/21/2022  ? ALT 91 (H) 03/21/2022  ? ALKPHOS 89 03/21/2022  ? BILITOT 0.7 03/21/2022  ? GFRNONAA >60 03/21/2022  ? ? ?Medications: I have reviewed the patient's current medications. ? ? ?Assessment/Plan: ? ?Stage IIIC melanoma (pT4apN1a), status post a radical excision of the left forearm melanoma, left axillary sentinel lymph node mapping/biopsy, and full thickness skin graft at the resection site 01/31/2021 ?Biopsy of left arm melanoma 12/28/2020-clinical stage IIb (cT3bcN0cM0), ulceration present, mitotic rate of 2/mm2 ?CTs chest, abdomen, and pelvis 01/31/2021-no evidence of metastatic disease ?01/31/2021- melanoma reexcision: Breslow depth 4.24 mm, Clark level V, 6 mitoses per millimeter squared, ulceration absent,pT4a, 1/6 lymph nodes with a 5 mm deposit of  metastatic melanoma,pN1a, BRAF mutation not detected ?Cycle 1 pembrolizumab 03/27/2021 ?Cycle 2 pembrolizumab 04/17/2021 ?Cycle 3 pembrolizumab 05/08/2021 ?Cycle 4 Pembrolizumab 05/29/2021 ?Cycle 5 pembrolizumab 06/17/2021 ?Cycle 6 pembrolizumab 07/08/2021 (changed to 6-week dosing) ?Last dose pembrolizumab January 2023 while living in [place] ?Restaging CTs 12/09/2021-new lung nodules ?12/25/2021-CT chest super D-numerous smooth round bilateral pulmonary nodules consistent with metastatic disease, new from September 2022 ?Navigate bronchoscopy 12/25/2021-biopsy of right lower lobe nodule-melanoma, right lower lobe FNA-malignant cells, right lower lobe bronchial lavage-malignant cells, 11 R lymph node FNA-negative for malignant cells ?Brain MRI 01/09/2022-negative for metastatic disease ?PET 01/20/2022-low-level SUV activity associated with small lung nodules, no other evidence of metastatic disease ?Cycle 1 ipilimumab/nivolumab 01/29/2022 ?Cycle 2 ipilimumab/nivolumab 02/19/2022 ?CT chest 03/21/2022-tiny right lower lobe nodule, negative for pneumonia, mild bronchial wall thickening ?Cycle 3 ipilimumab/nivolumab 03/25/2022 ?History of hepatitis B ?Hypertension ?Right hip osteoarthritis ?5.   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   Right hip replacement surgery 05/02/2021 ?7.   Left lower leg swelling 06/17/2021-negative Doppler 06/17/2021 ?8.   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  ? ? ? ?Disposition: ?[name] has metastatic melanoma.  He has experienced improvement in the upper respiratory symptoms over the past few weeks.  A CT chest 03/21/2022 revealed no evidence of pneumonia, pneumonitis, or disease progression.  We will request a direct comparison of the 03/21/2022 CT to the most recent imaging from [place]. ? ?Mr.  Steven Morrow will complete another cycle of ipilimumab/nivolumab today.  He will continue prednisone at a dose of 5 mg daily.  He will call for a  rash. ? ?Betsy Coder, MD ? ?03/25/2022  ?1:26 PM ? ? ?

## 2022-03-25 NOTE — Progress Notes (Signed)
Patient seen by Dr. Benay Spice today ? ?Vitals are within treatment parameters. ? ?Labs reviewed by Dr. Benay Spice  OK to treat based on labs collected on 03/21/22. ? ?Per physician team, patient is ready for treatment and there are NO modifications to the treatment plan.  ?

## 2022-03-26 ENCOUNTER — Telehealth: Payer: Self-pay | Admitting: Emergency Medicine

## 2022-03-26 NOTE — Telephone Encounter (Signed)
24 Hour Callback ?24 Hour callback post 1st time Yervoy and Opdivo. Patient was unavailable, spoke to his wife. She reports that he is doing well no side effects or reaction. Only complaint is fatigue. Reminded that if there are any problems or questions to call. ?

## 2022-04-12 ENCOUNTER — Other Ambulatory Visit: Payer: Self-pay | Admitting: Oncology

## 2022-04-17 ENCOUNTER — Inpatient Hospital Stay (HOSPITAL_BASED_OUTPATIENT_CLINIC_OR_DEPARTMENT_OTHER): Payer: Medicare Other | Admitting: Oncology

## 2022-04-17 ENCOUNTER — Inpatient Hospital Stay: Payer: Medicare Other

## 2022-04-17 ENCOUNTER — Encounter: Payer: Self-pay | Admitting: *Deleted

## 2022-04-17 VITALS — BP 109/63 | HR 98 | Temp 98.1°F | Resp 18 | Ht 66.0 in | Wt 189.4 lb

## 2022-04-17 VITALS — BP 123/69 | HR 73 | Temp 98.8°F | Resp 16

## 2022-04-17 DIAGNOSIS — C4362 Malignant melanoma of left upper limb, including shoulder: Secondary | ICD-10-CM

## 2022-04-17 LAB — CMP (CANCER CENTER ONLY)
ALT: 82 U/L — ABNORMAL HIGH (ref 0–44)
AST: 60 U/L — ABNORMAL HIGH (ref 15–41)
Albumin: 3.3 g/dL — ABNORMAL LOW (ref 3.5–5.0)
Alkaline Phosphatase: 135 U/L — ABNORMAL HIGH (ref 38–126)
Anion gap: 10 (ref 5–15)
BUN: 21 mg/dL (ref 8–23)
CO2: 24 mmol/L (ref 22–32)
Calcium: 8.8 mg/dL — ABNORMAL LOW (ref 8.9–10.3)
Chloride: 106 mmol/L (ref 98–111)
Creatinine: 1.05 mg/dL (ref 0.61–1.24)
GFR, Estimated: >60 mL/min (ref >60)
Glucose, Bld: 115 mg/dL — ABNORMAL HIGH (ref 70–99)
Potassium: 3.9 mmol/L (ref 3.5–5.1)
Sodium: 140 mmol/L (ref 135–145)
Total Bilirubin: 0.6 mg/dL (ref 0.3–1.2)
Total Protein: 6.9 g/dL (ref 6.5–8.1)

## 2022-04-17 LAB — CBC WITH DIFFERENTIAL (CANCER CENTER ONLY)
Abs Immature Granulocytes: 0.04 10*3/uL (ref 0.00–0.07)
Basophils Absolute: 0 10*3/uL (ref 0.0–0.1)
Basophils Relative: 0 %
Eosinophils Absolute: 0.2 10*3/uL (ref 0.0–0.5)
Eosinophils Relative: 2 %
HCT: 38.7 % — ABNORMAL LOW (ref 39.0–52.0)
Hemoglobin: 12.6 g/dL — ABNORMAL LOW (ref 13.0–17.0)
Immature Granulocytes: 0 %
Lymphocytes Relative: 15 %
Lymphs Abs: 1.4 10*3/uL (ref 0.7–4.0)
MCH: 30.7 pg (ref 26.0–34.0)
MCHC: 32.6 g/dL (ref 30.0–36.0)
MCV: 94.4 fL (ref 80.0–100.0)
Monocytes Absolute: 0.7 10*3/uL (ref 0.1–1.0)
Monocytes Relative: 8 %
Neutro Abs: 6.9 10*3/uL (ref 1.7–7.7)
Neutrophils Relative %: 75 %
Platelet Count: 249 10*3/uL (ref 150–400)
RBC: 4.1 MIL/uL — ABNORMAL LOW (ref 4.22–5.81)
RDW: 14.4 % (ref 11.5–15.5)
WBC Count: 9.2 10*3/uL (ref 4.0–10.5)
nRBC: 0 % (ref 0.0–0.2)

## 2022-04-17 LAB — TSH: TSH: 0.893 u[IU]/mL (ref 0.350–4.500)

## 2022-04-17 MED ORDER — SODIUM CHLORIDE 0.9 % IV SOLN: Freq: Once | INTRAVENOUS | Status: AC

## 2022-04-17 MED ORDER — DIPHENHYDRAMINE HCL 50 MG/ML IJ SOLN
25.0000 mg | Freq: Once | INTRAMUSCULAR | Status: AC
  Administered 2022-04-17: 25 mg via INTRAVENOUS
  Filled 2022-04-17: qty 1

## 2022-04-17 MED ORDER — SODIUM CHLORIDE 0.9 % IV SOLN
1.0000 mg/kg | Freq: Once | INTRAVENOUS | Status: AC
  Administered 2022-04-17: 93 mg via INTRAVENOUS
  Filled 2022-04-17: qty 9.3

## 2022-04-17 MED ORDER — SODIUM CHLORIDE 0.9 % IV SOLN
250.0000 mg | Freq: Once | INTRAVENOUS | Status: AC
  Administered 2022-04-17: 250 mg via INTRAVENOUS
  Filled 2022-04-17: qty 40

## 2022-04-17 MED ORDER — FAMOTIDINE IN NACL 20-0.9 MG/50ML-% IV SOLN
20.0000 mg | Freq: Once | INTRAVENOUS | Status: AC
  Administered 2022-04-17: 20 mg via INTRAVENOUS
  Filled 2022-04-17: qty 50

## 2022-04-17 NOTE — Progress Notes (Signed)
[facility] OFFICE PROGRESS NOTE   Diagnosis: Melanoma  INTERVAL HISTORY:   [name] completed a cycle of ipilimumab/nivolumab on 03/25/2022.  He reports the cough and dyspnea have resolved.  He developed diarrhea for several days followed by abdominal bloating/pain and frequent formed bowel movements for several weeks.  This began after he started using protein shakes.  He discontinued the protein shakes.  The abdominal symptoms have completely resolved.  No rash.  He continues prednisone at a dose of 5 mg daily.  Objective:  Vital signs in last 24 hours:  Blood pressure 109/63, pulse 98, temperature 98.1 F (36.7 C), temperature source Oral, resp. rate 18, height _0  (1.676 m), weight 189 lb 6.4 oz (85.9 kg), SpO2 100 %.    HEENT: No thrush or ulcers Resp: Lungs clear bilaterally Cardio: Regular rate and rhythm GI: No hepatosplenomegaly, nontender, no mass Vascular: The left lower leg is larger than the right side  Skin: No rash   Lab Results:  Lab Results  Component Value Date   WBC 9.2 04/17/2022   HGB 12.6 (L) 04/17/2022   HCT 38.7 (L) 04/17/2022   MCV 94.4 04/17/2022   PLT 249 04/17/2022   NEUTROABS 6.9 04/17/2022    CMP  Lab Results  Component Value Date   NA 140 04/17/2022   K 3.9 04/17/2022   CL 106 04/17/2022   CO2 24 04/17/2022   GLUCOSE 115 (H) 04/17/2022   BUN 21 04/17/2022   CREATININE 1.05 04/17/2022   CALCIUM 8.8 (L) 04/17/2022   PROT 6.9 04/17/2022   ALBUMIN 3.3 (L) 04/17/2022   AST 60 (H) 04/17/2022   ALT 82 (H) 04/17/2022   ALKPHOS 135 (H) 04/17/2022   BILITOT 0.6 04/17/2022   GFRNONAA >60 04/17/2022     Medications: I have reviewed the patient's current medications.   Assessment/Plan: Stage IIIC melanoma (pT4apN1a), status post a radical excision of the left forearm melanoma, left axillary sentinel lymph node mapping/biopsy, and full thickness skin graft at the resection site 01/31/2021 Biopsy of left arm melanoma  12/28/2020-clinical stage IIb ([id number]), ulceration present, mitotic rate of 2/mm2 CTs chest, abdomen, and pelvis 01/31/2021-no evidence of metastatic disease 01/31/2021- melanoma reexcision: Breslow depth 4.24 mm, Clark level V, 6 mitoses per millimeter squared, ulceration absent,pT4a, 1/6 lymph nodes with a 5 mm deposit of metastatic melanoma,pN1a, BRAF mutation not detected Cycle 1 pembrolizumab 03/27/2021 Cycle 2 pembrolizumab 04/17/2021 Cycle 3 pembrolizumab 05/08/2021 Cycle 4 Pembrolizumab 05/29/2021 Cycle 5 pembrolizumab 06/17/2021 Cycle 6 pembrolizumab 07/08/2021 (changed to 6-week dosing) Last dose pembrolizumab January 2023 while living in [place] Restaging CTs 12/09/2021-new lung nodules 12/25/2021-CT chest super D-numerous smooth round bilateral pulmonary nodules consistent with metastatic disease, new from September 2022 Navigate bronchoscopy 12/25/2021-biopsy of right lower lobe nodule-melanoma, right lower lobe FNA-malignant cells, right lower lobe bronchial lavage-malignant cells, 11 R lymph node FNA-negative for malignant cells Brain MRI 01/09/2022-negative for metastatic disease PET 01/20/2022-low-level SUV activity associated with small lung nodules, no other evidence of metastatic disease Cycle 1 ipilimumab/nivolumab 01/29/2022 Cycle 2 ipilimumab/nivolumab 02/19/2022 CT chest 03/21/2022-tiny right lower lobe nodule, negative for pneumonia, mild bronchial wall thickening Cycle 3 ipilimumab/nivolumab 03/25/2022 Cycle 4 ipilimumab/nivolumab 04/17/2022 History of hepatitis B Hypertension Right hip osteoarthritis 5.   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   Right hip replacement surgery 05/02/2021 7.   Left lower leg swelling 06/17/2021-negative Doppler 06/17/2021 8.   Melanoma in situ left back-February 2023 9.   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 appears stable.  He will complete cycle 4 ipilimumab/nivolumab today.  The plan is to change  to nivolumab maintenance after this cycle.  The frequent bowel movements and abdominal discomfort following the last cycle of treatment is most likely not related to colitis.  He reports formed bowel movements for the past several weeks.  He no longer has frequent bowel movements or abdominal pain.  He will taper the prednisone to off over the next several days.  He will return for an office visit and nivolumab in 2 weeks.  He plans to relocate to [place] for the summer.  We will plan monthly dosing with nivolumab.  We will request a formal comparison of the 03/13/2022 chest CT to the most recent imaging from [place].  Betsy Coder, MD  04/17/2022  11:39 AM

## 2022-04-17 NOTE — Progress Notes (Signed)
Patient seen by Dr. Benay Spice today  Vitals are within treatment parameters.  Labs reviewed by Dr. Benay Spice and are within treatment parameters. Slight elevation in AST/ALT--Ok to treat  Per physician team, patient is ready for treatment and there are NO modifications to the treatment plan.

## 2022-04-17 NOTE — Progress Notes (Signed)
Ipilimumab (YERVOY) Patient Monitoring Assessment   Is the patient experiencing any of the following general symptoms?:  '[ ]'$ Difficulty performing normal activities '[ ]'$ Feeling sluggish or cold all the time '[ ]'$ Unusual weight gain '[ ]'$ Constant or unusual headaches '[ ]'$ Feeling dizzy or faint '[ ]'$ Changes in eyesight (blurry vision, double vision, or other vision problems) '[ ]'$ Changes in mood or behavior (ex: decreased sex drive, irritability, or forgetfulness) '[ ]'$ Starting new medications (ex: steroids, other medications that lower immune response) [ x]Patient is not experiencing any of the general symptoms above.   Gastrointestinal  Patient is having 2 bowel movements each day.  Is this different from baseline? '[ ]'$ Yes '[ ]'$ xNo Are your stools watery or do they have a foul smell? '[ ]'$ Yes '[ ]'$ xNo Have you seen blood in your stools? '[ ]'$ Yes [ x]No Are your stools dark, tarry, or sticky? '[ ]'$ Yes [ x]No Are you having pain or tenderness in your belly? '[ ]'$ Yes [ x]No  Skin Does your skin itch? '[ ]'$ Yes [ x]No Do you have a rash? '[ ]'$ Yes [x ]No Has your skin blistered and/or peeled? '[ ]'$ Yes [x ]No Do you have sores in your mouth? '[ ]'$ Yes '[ ]'$ xNo  Hepatic Has your urine been dark or tea colored? '[ ]'$ Yes [ x]No Have you noticed that your skin or the whites of your eyes are turning yellow? '[ ]'$ Yes [x ]No Are you bleeding or bruising more easily than normal? '[ ]'$ Yes [ x]No Are you nauseous and/or vomiting? '[ ]'$ Yes [ x]No Do you have pain on the right side of your stomach? '[ ]'$ Yes [ x]No  Neurologic  Are you having unusual weakness of legs, arms, or face? '[ ]'$ Yes [ x]No Are you having numbness or tingling in your hands or feet? '[ ]'$ Yes [ x]No  [employer]

## 2022-04-17 NOTE — Patient Instructions (Signed)
[facility]   Discharge Instructions: Thank you for choosing [facility] to provide your oncology and hematology care.   If you have a lab appointment with the [facility], please go directly to the [facility] and check in at the registration area.   Wear comfortable clothing and clothing appropriate for easy access to any Portacath or PICC line.   We strive to give you quality time with your provider. You may need to reschedule your appointment if you arrive late (15 or more minutes).  Arriving late affects you and other patients whose appointments are after yours.  Also, if you miss three or more appointments without notifying the office, you may be dismissed from the clinic at the provider's discretion.      For prescription refill requests, have your pharmacy contact our office and allow 72 hours for refills to be completed.    Today you received the following chemotherapy and/or immunotherapy agents Opdivo, Yervoy      To help prevent nausea and vomiting after your treatment, we encourage you to take your nausea medication as directed.  BELOW ARE SYMPTOMS THAT SHOULD BE REPORTED IMMEDIATELY: *FEVER GREATER THAN 100.4 F (38 C) OR HIGHER *CHILLS OR SWEATING *NAUSEA AND VOMITING THAT IS NOT CONTROLLED WITH YOUR NAUSEA MEDICATION *UNUSUAL SHORTNESS OF BREATH *UNUSUAL BRUISING OR BLEEDING *URINARY PROBLEMS (pain or burning when urinating, or frequent urination) *BOWEL PROBLEMS (unusual diarrhea, constipation, pain near the anus) TENDERNESS IN MOUTH AND THROAT WITH OR WITHOUT PRESENCE OF ULCERS (sore throat, sores in mouth, or a toothache) UNUSUAL RASH, SWELLING OR PAIN  UNUSUAL VAGINAL DISCHARGE OR ITCHING   Items with * indicate a potential emergency and should be followed up as soon as possible or go to the Emergency Department if any problems should occur.  Please show the CHEMOTHERAPY ALERT CARD or IMMUNOTHERAPY ALERT CARD at check-in  to the Emergency Department and triage nurse.  Should you have questions after your visit or need to cancel or reschedule your appointment, please contact [facility]  Dept: [phone]  and follow the prompts.  Office hours are 8:00 a.m. to 4:30 p.m. Monday - Friday. Please note that voicemails left after 4:00 p.m. may not be returned until the following business day.  We are closed weekends and major holidays. You have access to a nurse at all times for urgent questions. Please call the main number to the clinic Dept: [phone] and follow the prompts.   For any non-urgent questions, you may also contact your provider using MyChart. We now offer e-Visits for anyone 21 and older to request care online for non-urgent symptoms. For details visit [email or web address].   Also download the MyChart app! Go to the app store, search "MyChart", open the app, select [facility], and log in with your MyChart username and password.  Due to Covid, a mask is required upon entering the hospital/clinic. If you do not have a mask, one will be given to you upon arrival. For doctor visits, patients may have 1 support person aged 42 or older with them. For treatment visits, patients cannot have anyone with them due to current Covid guidelines and our immunocompromised population.   Nivolumab injection What is this medication? NIVOLUMAB (nye VOL ue mab) is a monoclonal antibody. It treats certain types of cancer. Some of the cancers treated are colon cancer, head and neck cancer, Hodgkin lymphoma, lung cancer, and melanoma. This medicine may be used for other purposes;  ask your health care provider or pharmacist if you have questions. COMMON BRAND NAME(S): Opdivo What should I tell my care team before I take this medication? They need to know if you have any of these conditions: Autoimmune diseases such as Crohn's disease, ulcerative colitis, or lupus Have had or planning to have an  allogeneic stem cell transplant (uses someone else's stem cells) History of chest radiation Organ transplant Nervous system problems such as myasthenia gravis or Guillain-Barre syndrome An unusual or allergic reaction to nivolumab, other medicines, foods, dyes, or preservatives Pregnant or trying to get pregnant Breast-feeding How should I use this medication? This medication is injected into a vein. It is given in a hospital or clinic setting. A special MedGuide will be given to you before each treatment. Be sure to read this information carefully each time. Talk to your care team regarding the use of this medication in children. While it may be prescribed for children as young as 12 years for selected conditions, precautions do apply. Overdosage: If you think you have taken too much of this medicine contact a poison control center or emergency room at once. NOTE: This medicine is only for you. Do not share this medicine with others. What if I miss a dose? Keep appointments for follow-up doses. It is important not to miss your dose. Call your care team if you are unable to keep an appointment. What may interact with this medication? Interactions have not been studied. This list may not describe all possible interactions. Give your health care provider a list of all the medicines, herbs, non-prescription drugs, or dietary supplements you use. Also tell them if you smoke, drink alcohol, or use illegal drugs. Some items may interact with your medicine. What should I watch for while using this medication? Your condition will be monitored carefully while you are receiving this medication. You may need blood work done while you are taking this medication. Do not become pregnant while taking this medication or for 5 months after stopping it. Women should inform their care team if they wish to become pregnant or think they might be pregnant. There is a potential for serious harm to an unborn child.  Talk to your care team for more information. Do not breast-feed an infant while taking this medication or for 5 months after stopping it. What side effects may I notice from receiving this medication? Side effects that you should report to your care team as soon as possible: Allergic reactions--skin rash, itching, hives, swelling of the face, lips, tongue, or throat Bloody or black, tar-like stools Change in vision Chest pain Diarrhea Dry cough, shortness of breath or trouble breathing Eye pain Fast or irregular heartbeat Fever, chills High blood sugar (hyperglycemia)--increased thirst or amount of urine, unusual weakness or fatigue, blurry vision High thyroid levels (hyperthyroidism)--fast or irregular heartbeat, weight loss, excessive sweating or sensitivity to heat, tremors or shaking, anxiety, nervousness, irregular menstrual cycle or spotting Kidney injury--decrease in the amount of urine, swelling of the ankles, hands, or feet Liver injury--right upper belly pain, loss of appetite, nausea, light-colored stool, dark yellow or brown urine, yellowing skin or eyes, unusual weakness or fatigue Low red blood cell count--unusual weakness or fatigue, dizziness, headache, trouble breathing Low thyroid levels (hypothyroidism)--unusual weakness or fatigue, increased sensitivity to cold, constipation, hair loss, dry skin, weight gain, feelings of depression Mood and behavior changes-confusion, change in sex drive or performance, irritability Muscle pain or cramps Pain, tingling, or numbness in the hands or feet, muscle  weakness, trouble walking, loss of balance or coordination Red or dark brown urine Redness, blistering, peeling, or loosening of the skin, including inside the mouth Stomach pain Unusual bruising or bleeding Side effects that usually do not require medical attention (report to your care team if they continue or are bothersome): Bone pain Constipation Loss of  appetite Nausea Tiredness Vomiting This list may not describe all possible side effects. Call your doctor for medical advice about side effects. You may report side effects to FDA at 1-800-FDA-1088. Where should I keep my medication? This medication is given in a hospital or clinic and will not be stored at home. NOTE: This sheet is a summary. It may not cover all possible information. If you have questions about this medicine, talk to your doctor, pharmacist, or health care provider.  2023 Elsevier/Gold Standard (2021-10-11 00:00:00)  Ipilimumab injection What is this medication? IPILIMUMAB (IP i LIM ue mab) is a monoclonal antibody. It treats colorectal cancer, esophageal cancer, kidney cancer, liver cancer, lung cancer, melanoma, and mesothelioma. This medicine may be used for other purposes; ask your health care provider or pharmacist if you have questions. COMMON BRAND NAME(S): YERVOY What should I tell my care team before I take this medication? They need to know if you have any of these conditions: autoimmune diseases like Crohn's disease, ulcerative colitis, or lupus have had or planning to have an allogeneic stem cell transplant (uses someone else's stem cells) history of organ transplant nervous system problems like myasthenia gravis or Guillain-Barre syndrome an unusual or allergic reaction to ipilimumab, other medicines, foods, dyes, or preservatives pregnant or trying to get pregnant breast-feeding How should I use this medication? This medicine is for infusion into a vein. It is given by a health care professional in a hospital or clinic setting. A special MedGuide will be given to you before each treatment. Be sure to read this information carefully each time. Talk to your pediatrician regarding the use of this medicine in children. While this drug may be prescribed for children as young as 12 years for selected conditions, precautions do apply. Overdosage: If you think  you have taken too much of this medicine contact a poison control center or emergency room at once. NOTE: This medicine is only for you. Do not share this medicine with others. What if I miss a dose? It is important not to miss your dose. Call your doctor or health care professional if you are unable to keep an appointment. What may interact with this medication? Interactions are not expected. This list may not describe all possible interactions. Give your health care provider a list of all the medicines, herbs, non-prescription drugs, or dietary supplements you use. Also tell them if you smoke, drink alcohol, or use illegal drugs. Some items may interact with your medicine. What should I watch for while using this medication? Tell your doctor or healthcare professional if your symptoms do not start to get better or if they get worse. Do not become pregnant while taking this medicine or for 3 months after stopping it. Women should inform their doctor if they wish to become pregnant or think they might be pregnant. There is a potential for serious side effects to an unborn child. Talk to your health care professional or pharmacist for more information. Do not breast-feed an infant while taking this medicine or for 3 months after the last dose. Your condition will be monitored carefully while you are receiving this medicine. You may need blood work  done while you are taking this medicine. What side effects may I notice from receiving this medication? Side effects that you should report to your doctor or health care professional as soon as possible: allergic reactions like skin rash, itching or hives, swelling of the face, lips, or tongue black, tarry stools bloody or watery diarrhea changes in vision dizziness eye pain fast, irregular heartbeat feeling anxious feeling faint or lightheaded, falls nausea, vomiting pain, tingling, numbness in the hands or feet redness, blistering, peeling or  loosening of the skin, including inside the mouth signs and symptoms of liver injury like dark yellow or brown urine; general ill feeling or flu-like symptoms; light-colored stools; loss of appetite; nausea; right upper belly pain; unusually weak or tired; yellowing of the eyes or skin unusual bleeding or bruising Side effects that usually do not require medical attention (report to your doctor or health care professional if they continue or are bothersome): headache loss of appetite trouble sleeping This list may not describe all possible side effects. Call your doctor for medical advice about side effects. You may report side effects to FDA at 1-800-FDA-1088. Where should I keep my medication? This drug is given in a hospital or clinic and will not be stored at home. NOTE: This sheet is a summary. It may not cover all possible information. If you have questions about this medicine, talk to your doctor, pharmacist, or health care provider.  2023 Elsevier/Gold Standard (2021-10-11 00:00:00)

## 2022-04-21 ENCOUNTER — Encounter: Payer: Self-pay | Admitting: Oncology

## 2022-04-23 ENCOUNTER — Other Ambulatory Visit: Payer: Self-pay | Admitting: Oncology

## 2022-04-27 ENCOUNTER — Other Ambulatory Visit: Payer: Self-pay | Admitting: Oncology

## 2022-05-01 ENCOUNTER — Encounter: Payer: Self-pay | Admitting: *Deleted

## 2022-05-01 NOTE — Progress Notes (Signed)
Received fax from [facility] requesting medical records on patient. Request was forwarded via email to HIM department.

## 2022-05-02 ENCOUNTER — Inpatient Hospital Stay: Payer: Medicare Other

## 2022-05-02 ENCOUNTER — Telehealth: Payer: Self-pay

## 2022-05-02 ENCOUNTER — Other Ambulatory Visit: Payer: Self-pay

## 2022-05-02 ENCOUNTER — Other Ambulatory Visit (HOSPITAL_BASED_OUTPATIENT_CLINIC_OR_DEPARTMENT_OTHER): Payer: Self-pay

## 2022-05-02 ENCOUNTER — Inpatient Hospital Stay (HOSPITAL_BASED_OUTPATIENT_CLINIC_OR_DEPARTMENT_OTHER): Payer: Medicare Other | Admitting: Oncology

## 2022-05-02 ENCOUNTER — Inpatient Hospital Stay: Payer: Medicare Other | Attending: Oncology

## 2022-05-02 VITALS — BP 99/55 | HR 80

## 2022-05-02 VITALS — BP 98/65 | HR 65 | Temp 98.2°F | Resp 18 | Ht 66.0 in | Wt 188.6 lb

## 2022-05-02 DIAGNOSIS — R600 Localized edema: Secondary | ICD-10-CM | POA: Diagnosis not present

## 2022-05-02 DIAGNOSIS — C7801 Secondary malignant neoplasm of right lung: Secondary | ICD-10-CM | POA: Insufficient documentation

## 2022-05-02 DIAGNOSIS — C4362 Malignant melanoma of left upper limb, including shoulder: Secondary | ICD-10-CM

## 2022-05-02 DIAGNOSIS — Z8619 Personal history of other infectious and parasitic diseases: Secondary | ICD-10-CM | POA: Diagnosis not present

## 2022-05-02 DIAGNOSIS — I10 Essential (primary) hypertension: Secondary | ICD-10-CM | POA: Diagnosis not present

## 2022-05-02 DIAGNOSIS — M1611 Unilateral primary osteoarthritis, right hip: Secondary | ICD-10-CM | POA: Diagnosis not present

## 2022-05-02 DIAGNOSIS — Z96641 Presence of right artificial hip joint: Secondary | ICD-10-CM | POA: Diagnosis not present

## 2022-05-02 DIAGNOSIS — Z5112 Encounter for antineoplastic immunotherapy: Secondary | ICD-10-CM | POA: Insufficient documentation

## 2022-05-02 LAB — CBC WITH DIFFERENTIAL (CANCER CENTER ONLY)
Abs Immature Granulocytes: 0.04 10*3/uL (ref 0.00–0.07)
Basophils Absolute: 0.1 10*3/uL (ref 0.0–0.1)
Basophils Relative: 1 %
Eosinophils Absolute: 0.5 10*3/uL (ref 0.0–0.5)
Eosinophils Relative: 6 %
HCT: 37.5 % — ABNORMAL LOW (ref 39.0–52.0)
Hemoglobin: 12.3 g/dL — ABNORMAL LOW (ref 13.0–17.0)
Immature Granulocytes: 0 %
Lymphocytes Relative: 22 %
Lymphs Abs: 2 10*3/uL (ref 0.7–4.0)
MCH: 31 pg (ref 26.0–34.0)
MCHC: 32.8 g/dL (ref 30.0–36.0)
MCV: 94.5 fL (ref 80.0–100.0)
Monocytes Absolute: 0.9 10*3/uL (ref 0.1–1.0)
Monocytes Relative: 10 %
Neutro Abs: 5.6 10*3/uL (ref 1.7–7.7)
Neutrophils Relative %: 61 %
Platelet Count: 276 10*3/uL (ref 150–400)
RBC: 3.97 MIL/uL — ABNORMAL LOW (ref 4.22–5.81)
RDW: 14.9 % (ref 11.5–15.5)
WBC Count: 9.1 10*3/uL (ref 4.0–10.5)
nRBC: 0 % (ref 0.0–0.2)

## 2022-05-02 LAB — CMP (CANCER CENTER ONLY)
ALT: 63 U/L — ABNORMAL HIGH (ref 0–44)
AST: 52 U/L — ABNORMAL HIGH (ref 15–41)
Albumin: 3.5 g/dL (ref 3.5–5.0)
Alkaline Phosphatase: 131 U/L — ABNORMAL HIGH (ref 38–126)
Anion gap: 10 (ref 5–15)
BUN: 21 mg/dL (ref 8–23)
CO2: 23 mmol/L (ref 22–32)
Calcium: 9.3 mg/dL (ref 8.9–10.3)
Chloride: 103 mmol/L (ref 98–111)
Creatinine: 1.22 mg/dL (ref 0.61–1.24)
GFR, Estimated: >60 mL/min (ref >60)
Glucose, Bld: 103 mg/dL — ABNORMAL HIGH (ref 70–99)
Potassium: 4 mmol/L (ref 3.5–5.1)
Sodium: 136 mmol/L (ref 135–145)
Total Bilirubin: 0.8 mg/dL (ref 0.3–1.2)
Total Protein: 7.2 g/dL (ref 6.5–8.1)

## 2022-05-02 LAB — TSH: TSH: 0.141 u[IU]/mL — ABNORMAL LOW (ref 0.350–4.500)

## 2022-05-02 LAB — T4, FREE: Free T4: 0.79 ng/dL (ref 0.61–1.12)

## 2022-05-02 LAB — CORTISOL: Cortisol, Plasma: 10.8 ug/dL

## 2022-05-02 MED ORDER — SODIUM CHLORIDE 0.9 % IV SOLN: Freq: Once | INTRAVENOUS | Status: AC

## 2022-05-02 MED ORDER — SODIUM CHLORIDE 0.9 % IV SOLN
480.0000 mg | Freq: Once | INTRAVENOUS | Status: AC
  Administered 2022-05-02: 480 mg via INTRAVENOUS
  Filled 2022-05-02: qty 48

## 2022-05-02 NOTE — Progress Notes (Signed)
[facility] OFFICE PROGRESS NOTE   Diagnosis: Melanoma  INTERVAL HISTORY:   [name] completed another cycle of ipilimumab and nivolumab on 04/17/2022.  No rash or diarrhea.  He feels well.  Good appetite.  No new complaint.  He plans to leave for [place] early next week.  He reports improvement in left leg edema with the use of a support stocking.  Objective:  Vital signs in last 24 hours:  Blood pressure 98/65, pulse 65, temperature 98.2 F (36.8 C), temperature source Oral, resp. rate 18, height '5\' 6"'  (1.676 m), weight 188 lb 9.6 oz (85.5 kg), SpO2 98 %.    HEENT: No thrush or ulcers Resp: Lungs clear bilaterally Cardio: Regular rate and rhythm, distant heart sounds GI: No hepatosplenomegaly, nontender Vascular: Support stocking of the left lower leg  Skin: No rash   Lab Results:  Lab Results  Component Value Date   WBC 9.1 05/02/2022   HGB 12.3 (L) 05/02/2022   HCT 37.5 (L) 05/02/2022   MCV 94.5 05/02/2022   PLT 276 05/02/2022   NEUTROABS 5.6 05/02/2022    CMP  Lab Results  Component Value Date   NA 136 05/02/2022   K 4.0 05/02/2022   CL 103 05/02/2022   CO2 23 05/02/2022   GLUCOSE 103 (H) 05/02/2022   BUN 21 05/02/2022   CREATININE 1.22 05/02/2022   CALCIUM 9.3 05/02/2022   PROT 7.2 05/02/2022   ALBUMIN 3.5 05/02/2022   AST 52 (H) 05/02/2022   ALT 63 (H) 05/02/2022   ALKPHOS 131 (H) 05/02/2022   BILITOT 0.8 05/02/2022   GFRNONAA >60 05/02/2022     Medications: I have reviewed the patient's current medications.   Assessment/Plan: Stage IIIC melanoma (pT4apN1a), status post a radical excision of the left forearm melanoma, left axillary sentinel lymph node mapping/biopsy, and full thickness skin graft at the resection site 01/31/2021 Biopsy of left arm melanoma 12/28/2020-clinical stage IIb ([id number]), ulceration present, mitotic rate of 2/mm2 CTs chest, abdomen, and pelvis 01/31/2021-no evidence of metastatic  disease 01/31/2021- melanoma reexcision: Breslow depth 4.24 mm, Clark level V, 6 mitoses per millimeter squared, ulceration absent,pT4a, 1/6 lymph nodes with a 5 mm deposit of metastatic melanoma,pN1a, BRAF mutation not detected Cycle 1 pembrolizumab 03/27/2021 Cycle 2 pembrolizumab 04/17/2021 Cycle 3 pembrolizumab 05/08/2021 Cycle 4 Pembrolizumab 05/29/2021 Cycle 5 pembrolizumab 06/17/2021 Cycle 6 pembrolizumab 07/08/2021 (changed to 6-week dosing) Last dose pembrolizumab January 2023 while living in [place] Restaging CTs 12/09/2021-new lung nodules 12/25/2021-CT chest super D-numerous smooth round bilateral pulmonary nodules consistent with metastatic disease, new from September 2022 Navigate bronchoscopy 12/25/2021-biopsy of right lower lobe nodule-melanoma, right lower lobe FNA-malignant cells, right lower lobe bronchial lavage-malignant cells, 11 R lymph node FNA-negative for malignant cells Brain MRI 01/09/2022-negative for metastatic disease PET 01/20/2022-low-level SUV activity associated with small lung nodules, no other evidence of metastatic disease Cycle 1 ipilimumab/nivolumab 01/29/2022 Cycle 2 ipilimumab/nivolumab 02/19/2022 CT chest 03/21/2022-tiny right lower lobe nodule, negative for pneumonia, mild bronchial wall thickening Cycle 3 ipilimumab/nivolumab 03/25/2022 Cycle 4 ipilimumab/nivolumab 04/17/2022 Cycle 1 nivolumab-monthly schedule 05/02/2022 History of hepatitis B Hypertension Right hip osteoarthritis 5.   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   Right hip replacement surgery 05/02/2021 7.   Left lower leg swelling 06/17/2021-negative Doppler 06/17/2021 8.   Melanoma in situ left back-February 2023 9.   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 appears well.  He has completed 4 cycles of ipilimumab/nivolumab.  The plan is  to continue nivolumab.  He will begin nivolumab on a monthly schedule today.  He appears to be tolerating the  treatment well.  The liver enzymes are mildly elevated and stable over the past several months.  The mild elevation of the liver enzymes could be related to toxicity from immunotherapy or another etiology.  He reports being maintained on Pravachol for years.  The blood pressure is low today.  We will repeat his blood pressure and ask him to hold the antihypertensive if still low.  We will check a TSH and cortisol level today. He will return for an office visit and nivolumab in 1 month.  Betsy Coder, MD  05/02/2022  11:29 AM

## 2022-05-02 NOTE — Telephone Encounter (Signed)
Patient seen by Dr. Benay Spice today  Vitals are 98/60 repeat before treatment  Labs reviewed by Dr. Benay Spice and are within treatment parameters.  Per physician team, patient is ready for treatment and there are NO modifications to the treatment plan.

## 2022-05-02 NOTE — Telephone Encounter (Signed)
T4,Free T4 added to labs

## 2022-05-02 NOTE — Telephone Encounter (Signed)
-----   Message from Ladell Pier, MD sent at 05/02/2022  4:28 PM EDT ----- TSH is low, check T4 and free T4-add to labs from 05/02/2022

## 2022-05-02 NOTE — Patient Instructions (Signed)
[facility]   Discharge Instructions: Thank you for choosing [facility] to provide your oncology and hematology care.   If you have a lab appointment with the Cancer Center, please go directly to the Cancer Center and check in at the registration area.   Wear comfortable clothing and clothing appropriate for easy access to any Portacath or PICC line.   We strive to give you quality time with your provider. You may need to reschedule your appointment if you arrive late (15 or more minutes).  Arriving late affects you and other patients whose appointments are after yours.  Also, if you miss three or more appointments without notifying the office, you may be dismissed from the clinic at the provider's discretion.      For prescription refill requests, have your pharmacy contact our office and allow 72 hours for refills to be completed.    Today you received the following chemotherapy and/or immunotherapy agents: Opdivo      To help prevent nausea and vomiting after your treatment, we encourage you to take your nausea medication as directed.  BELOW ARE SYMPTOMS THAT SHOULD BE REPORTED IMMEDIATELY: *FEVER GREATER THAN 100.4 F (38 C) OR HIGHER *CHILLS OR SWEATING *NAUSEA AND VOMITING THAT IS NOT CONTROLLED WITH YOUR NAUSEA MEDICATION *UNUSUAL SHORTNESS OF BREATH *UNUSUAL BRUISING OR BLEEDING *URINARY PROBLEMS (pain or burning when urinating, or frequent urination) *BOWEL PROBLEMS (unusual diarrhea, constipation, pain near the anus) TENDERNESS IN MOUTH AND THROAT WITH OR WITHOUT PRESENCE OF ULCERS (sore throat, sores in mouth, or a toothache) UNUSUAL RASH, SWELLING OR PAIN  UNUSUAL VAGINAL DISCHARGE OR ITCHING   Items with * indicate a potential emergency and should be followed up as soon as possible or go to the Emergency Department if any problems should occur.  Please show the CHEMOTHERAPY ALERT CARD or IMMUNOTHERAPY ALERT CARD at check-in to the  Emergency Department and triage nurse.  Should you have questions after your visit or need to cancel or reschedule your appointment, please contact [facility]  Dept: [phone]  and follow the prompts.  Office hours are 8:00 a.m. to 4:30 p.m. Monday - Friday. Please note that voicemails left after 4:00 p.m. may not be returned until the following business day.  We are closed weekends and major holidays. You have access to a nurse at all times for urgent questions. Please call the main number to the clinic Dept: [phone] and follow the prompts.   For any non-urgent questions, you may also contact your provider using MyChart. We now offer e-Visits for anyone 18 and older to request care online for non-urgent symptoms. For details visit [email or web address].   Also download the MyChart app! Go to the app store, search "MyChart", open the app, select [facility], and log in with your MyChart username and password.  Due to Covid, a mask is required upon entering the hospital/clinic. If you do not have a mask, one will be given to you upon arrival. For doctor visits, patients may have 1 support person aged 18 or older with them. For treatment visits, patients cannot have anyone with them due to current Covid guidelines and our immunocompromised population.   Nivolumab injection What is this medication? NIVOLUMAB (nye VOL ue mab) is a monoclonal antibody. It treats certain types of cancer. Some of the cancers treated are colon cancer, head and neck cancer, Hodgkin lymphoma, lung cancer, and melanoma. This medicine may be used for other purposes; ask   your health care provider or pharmacist if you have questions. COMMON BRAND NAME(S): Opdivo What should I tell my care team before I take this medication? They need to know if you have any of these conditions: Autoimmune diseases such as Crohn's disease, ulcerative colitis, or lupus Have had or planning to have an  allogeneic stem cell transplant (uses someone else's stem cells) History of chest radiation Organ transplant Nervous system problems such as myasthenia gravis or Guillain-Barre syndrome An unusual or allergic reaction to nivolumab, other medicines, foods, dyes, or preservatives Pregnant or trying to get pregnant Breast-feeding How should I use this medication? This medication is injected into a vein. It is given in a hospital or clinic setting. A special MedGuide will be given to you before each treatment. Be sure to read this information carefully each time. Talk to your care team regarding the use of this medication in children. While it may be prescribed for children as young as 12 years for selected conditions, precautions do apply. Overdosage: If you think you have taken too much of this medicine contact a poison control center or emergency room at once. NOTE: This medicine is only for you. Do not share this medicine with others. What if I miss a dose? Keep appointments for follow-up doses. It is important not to miss your dose. Call your care team if you are unable to keep an appointment. What may interact with this medication? Interactions have not been studied. This list may not describe all possible interactions. Give your health care provider a list of all the medicines, herbs, non-prescription drugs, or dietary supplements you use. Also tell them if you smoke, drink alcohol, or use illegal drugs. Some items may interact with your medicine. What should I watch for while using this medication? Your condition will be monitored carefully while you are receiving this medication. You may need blood work done while you are taking this medication. Do not become pregnant while taking this medication or for 5 months after stopping it. Women should inform their care team if they wish to become pregnant or think they might be pregnant. There is a potential for serious harm to an unborn child.  Talk to your care team for more information. Do not breast-feed an infant while taking this medication or for 5 months after stopping it. What side effects may I notice from receiving this medication? Side effects that you should report to your care team as soon as possible: Allergic reactions--skin rash, itching, hives, swelling of the face, lips, tongue, or throat Bloody or black, tar-like stools Change in vision Chest pain Diarrhea Dry cough, shortness of breath or trouble breathing Eye pain Fast or irregular heartbeat Fever, chills High blood sugar (hyperglycemia)--increased thirst or amount of urine, unusual weakness or fatigue, blurry vision High thyroid levels (hyperthyroidism)--fast or irregular heartbeat, weight loss, excessive sweating or sensitivity to heat, tremors or shaking, anxiety, nervousness, irregular menstrual cycle or spotting Kidney injury--decrease in the amount of urine, swelling of the ankles, hands, or feet Liver injury--right upper belly pain, loss of appetite, nausea, light-colored stool, dark yellow or brown urine, yellowing skin or eyes, unusual weakness or fatigue Low red blood cell count--unusual weakness or fatigue, dizziness, headache, trouble breathing Low thyroid levels (hypothyroidism)--unusual weakness or fatigue, increased sensitivity to cold, constipation, hair loss, dry skin, weight gain, feelings of depression Mood and behavior changes-confusion, change in sex drive or performance, irritability Muscle pain or cramps Pain, tingling, or numbness in the hands or feet, muscle weakness,   trouble walking, loss of balance or coordination Red or dark brown urine Redness, blistering, peeling, or loosening of the skin, including inside the mouth Stomach pain Unusual bruising or bleeding Side effects that usually do not require medical attention (report to your care team if they continue or are bothersome): Bone pain Constipation Loss of  appetite Nausea Tiredness Vomiting This list may not describe all possible side effects. Call your doctor for medical advice about side effects. You may report side effects to FDA at 1-800-FDA-1088. Where should I keep my medication? This medication is given in a hospital or clinic and will not be stored at home. NOTE: This sheet is a summary. It may not cover all possible information. If you have questions about this medicine, talk to your doctor, pharmacist, or health care provider.  2023 Elsevier/Gold Standard (2021-10-11 00:00:00)  

## 2022-05-03 LAB — T4: T4, Total: 6.1 ug/dL (ref 4.5–12.0)

## 2022-05-08 ENCOUNTER — Encounter: Payer: Self-pay | Admitting: Oncology

## 2022-05-09 ENCOUNTER — Telehealth: Payer: Self-pay

## 2022-05-09 NOTE — Telephone Encounter (Addendum)
TC from Pt's wife stating Pt is doing better and still admitted to the [facility]. Pt's wife stated that she gave the doctors the office number to get in contact with Dr Benay Spice about his care and the doctor's there feel he should not get his next treatment. Informed Pt's wife that Dr Benay Spice stated the diarrhea could be from the nivolunab and did not receive a call from the doctor's at [facility] as of yet and will discuss Pt's care when they call. Pt's wife stated his next appointment she wanted to change to a phone visit or video visit. But will wait to see what will happen when Dr. Benay Spice speaks with Doctor's from [place].

## 2022-05-15 ENCOUNTER — Encounter: Payer: Self-pay | Admitting: *Deleted

## 2022-05-15 NOTE — Progress Notes (Signed)
Received faxed request from [employer] requesting records from 11/24/20 to YTD . Emailed this request to HIM team.

## 2022-05-18 ENCOUNTER — Encounter: Payer: Self-pay | Admitting: Oncology

## 2022-05-22 ENCOUNTER — Encounter: Payer: Self-pay | Admitting: *Deleted

## 2022-05-22 NOTE — Progress Notes (Signed)
Received request for medical records from [facility] from 11/24/21 to present. Request was emailed to HIM department.

## 2022-05-26 ENCOUNTER — Encounter: Payer: Self-pay | Admitting: *Deleted

## 2022-05-31 ENCOUNTER — Other Ambulatory Visit: Payer: Self-pay | Admitting: Oncology

## 2022-06-06 ENCOUNTER — Inpatient Hospital Stay: Payer: Medicare Other

## 2022-06-06 ENCOUNTER — Inpatient Hospital Stay: Payer: Medicare Other | Attending: Oncology | Admitting: Oncology

## 2022-06-06 DIAGNOSIS — C7801 Secondary malignant neoplasm of right lung: Secondary | ICD-10-CM | POA: Insufficient documentation

## 2022-06-06 DIAGNOSIS — C4362 Malignant melanoma of left upper limb, including shoulder: Secondary | ICD-10-CM | POA: Diagnosis not present

## 2022-06-06 NOTE — Progress Notes (Signed)
[name] OFFICE VISIT PROGRESS NOTE  I connected with [name] on 06/06/22 at 10:20 AM EDT by video and verified that I am speaking with the correct person using two identifiers.   I discussed the limitations, risks, security and privacy concerns of performing an evaluation and management service by telemedicine and the availability of in-person appointments. I also discussed with the patient that there may be a patient responsible charge related to this service. The patient expressed understanding and agreed to proceed.  Other persons participating in the visit and their role in the encounter: Wife  Patient's location: Home Provider's location: Office    Diagnosis: Melanoma  INTERVAL HISTORY:   [name] completed a cycle of nivolumab on 05/02/2022.  He left for [place] beginning 05/05/2022.  He noted the onset of diarrhea and weakness while traveling.  He had a presyncope event while in a hotel 05/05/2022.  He arrived at his destination the following day.  He continued to have diarrhea and dizziness.  His wife reports his blood pressure returned at 72/52 on a home monitor.  He had very little to eat or drink for several days.  He was taken to the emergency room by EMS.  He was admitted to [facility] healthcare 05/07/2022.  A CT of the chest revealed no suspicious pulmonary nodules. The cortisol level is low.  An infectious work-up was negative.  He was discharged home on 05/10/2022.  Diarrhea has resolved.  He feels well at present.  Blood pressure medications were discontinued by his primary provider.  Prednisone was decreased from 10 mg daily to 5 mg daily beginning this week.  [name] plans to return to [facility] 06/24/2022.  Lab Results:  Lab Results  Component Value Date   WBC 9.1 05/02/2022   HGB 12.3 (L) 05/02/2022   HCT 37.5 (L) 05/02/2022   MCV 94.5 05/02/2022   PLT 276 05/02/2022   NEUTROABS 5.6  05/02/2022     Medications: I have reviewed the patient's current medications.  Assessment/Plan: Stage IIIC melanoma ([id number]), status post a radical excision of the left forearm melanoma, left axillary sentinel lymph node mapping/biopsy, and full thickness skin graft at the resection site 01/31/2021 Biopsy of left arm melanoma 12/28/2020-clinical stage IIb ([id number]), ulceration present, mitotic rate of 2/mm2 CTs chest, abdomen, and pelvis 01/31/2021-no evidence of metastatic disease 01/31/2021- melanoma reexcision: Breslow depth 4.24 mm, Clark level V, 6 mitoses per millimeter squared, ulceration absent,pT4a, 1/6 lymph nodes with a 5 mm deposit of metastatic melanoma,pN1a, BRAF mutation not detected Cycle 1 pembrolizumab 03/27/2021 Cycle 2 pembrolizumab 04/17/2021 Cycle 3 pembrolizumab 05/08/2021 Cycle 4 Pembrolizumab 05/29/2021 Cycle 5 pembrolizumab 06/17/2021 Cycle 6 pembrolizumab 07/08/2021 (changed to 6-week dosing) Last dose pembrolizumab January 2023 while living in [place] Restaging CTs 12/09/2021-new lung nodules 12/25/2021-CT chest super D-numerous smooth round bilateral pulmonary nodules consistent with metastatic disease, new from September 2022 Navigate bronchoscopy 12/25/2021-biopsy of right lower lobe nodule-melanoma, right lower lobe FNA-malignant cells, right lower lobe bronchial lavage-malignant cells, 11 R lymph node FNA-negative for malignant cells Brain MRI 01/09/2022-negative for metastatic disease PET 01/20/2022-low-level SUV activity associated with small lung nodules, no other evidence of metastatic disease Cycle 1 ipilimumab/nivolumab 01/29/2022 Cycle 2 ipilimumab/nivolumab 02/19/2022 CT chest 03/21/2022-tiny right lower lobe nodule, negative for pneumonia, mild bronchial wall thickening, comparison to 12/25/2021 images revealed marked improvement in right lung nodules consistent with a response to therapy Cycle 3 ipilimumab/nivolumab 03/25/2022 Cycle 4 ipilimumab/nivolumab  04/17/2022 Cycle 1 nivolumab-monthly schedule 05/02/2022  History of hepatitis B Hypertension Right hip osteoarthritis 5.   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   Right hip replacement surgery 05/02/2021 7.   Left lower leg swelling 06/17/2021-negative Doppler 06/17/2021 8.   Melanoma in situ left back-February 2023 9.   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  Admission to [facility] healthcare 05/07/2022 - 05/10/2022 with diarrhea and dehydration        Disposition: [name] has a history of metastatic melanoma.  He was last treated with nivolumab on 05/02/2022.  He was admitted to [facility] several days later with diarrhea and dehydration.  It is unclear whether his symptoms are related to nivolumab.  It would be unusual to develop acute diarrhea with rapid resolution from nivolumab.  He plans to return to [facility] 06/24/2022.  I recommend continuing nivolumab since there is radiologic evidence of a response.  He progressed at the end of pembrolizumab therapy earlier this year.   We will check a cortisol level and TSH when he returns for an office visit 06/27/2022.   I discussed the assessment and treatment plan with the patient. The patient was provided an opportunity to ask questions and all were answered. The patient agreed with the plan and demonstrated an understanding of the instructions.   The patient was advised to call back or seek an in-person evaluation if the symptoms worsen or if the condition fails to improve as anticipated.  I provided 40 minutes of chart review, video, and documentation time during this encounter, and > 50% was spent counseling as documented under my assessment & plan.  Betsy Coder ANP/GNP-BC   06/06/2022 10:40 AM

## 2022-06-09 ENCOUNTER — Telehealth: Payer: Self-pay | Admitting: Oncology

## 2022-06-09 NOTE — Telephone Encounter (Signed)
Attempted to contact patient in regards to scheduling follow ups from LOS 7/14. No answer so voicemail was left for patient to call back

## 2022-06-16 ENCOUNTER — Other Ambulatory Visit: Payer: Self-pay

## 2022-06-22 ENCOUNTER — Other Ambulatory Visit: Payer: Self-pay | Admitting: Oncology

## 2022-06-27 ENCOUNTER — Encounter: Payer: Self-pay | Admitting: *Deleted

## 2022-06-27 ENCOUNTER — Inpatient Hospital Stay: Payer: Medicare Other

## 2022-06-27 ENCOUNTER — Telehealth: Payer: Self-pay

## 2022-06-27 ENCOUNTER — Other Ambulatory Visit: Payer: Self-pay

## 2022-06-27 ENCOUNTER — Inpatient Hospital Stay (HOSPITAL_BASED_OUTPATIENT_CLINIC_OR_DEPARTMENT_OTHER): Payer: Medicare Other | Admitting: Oncology

## 2022-06-27 ENCOUNTER — Inpatient Hospital Stay: Payer: Medicare Other | Attending: Oncology

## 2022-06-27 VITALS — BP 145/86 | HR 80

## 2022-06-27 VITALS — BP 150/92 | HR 85 | Temp 98.2°F | Resp 18 | Ht 66.0 in | Wt 190.0 lb

## 2022-06-27 DIAGNOSIS — Z5112 Encounter for antineoplastic immunotherapy: Secondary | ICD-10-CM | POA: Insufficient documentation

## 2022-06-27 DIAGNOSIS — C4362 Malignant melanoma of left upper limb, including shoulder: Secondary | ICD-10-CM

## 2022-06-27 DIAGNOSIS — Z96641 Presence of right artificial hip joint: Secondary | ICD-10-CM | POA: Diagnosis not present

## 2022-06-27 DIAGNOSIS — Z79899 Other long term (current) drug therapy: Secondary | ICD-10-CM | POA: Diagnosis not present

## 2022-06-27 DIAGNOSIS — I10 Essential (primary) hypertension: Secondary | ICD-10-CM | POA: Insufficient documentation

## 2022-06-27 DIAGNOSIS — Z8619 Personal history of other infectious and parasitic diseases: Secondary | ICD-10-CM | POA: Insufficient documentation

## 2022-06-27 DIAGNOSIS — E860 Dehydration: Secondary | ICD-10-CM | POA: Diagnosis not present

## 2022-06-27 DIAGNOSIS — Z96652 Presence of left artificial knee joint: Secondary | ICD-10-CM | POA: Diagnosis not present

## 2022-06-27 DIAGNOSIS — C7801 Secondary malignant neoplasm of right lung: Secondary | ICD-10-CM | POA: Diagnosis present

## 2022-06-27 LAB — CMP (CANCER CENTER ONLY)
ALT: 35 U/L (ref 0–44)
AST: 30 U/L (ref 15–41)
Albumin: 4.3 g/dL (ref 3.5–5.0)
Alkaline Phosphatase: 102 U/L (ref 38–126)
Anion gap: 11 (ref 5–15)
BUN: 21 mg/dL (ref 8–23)
CO2: 25 mmol/L (ref 22–32)
Calcium: 9.9 mg/dL (ref 8.9–10.3)
Chloride: 105 mmol/L (ref 98–111)
Creatinine: 1.27 mg/dL — ABNORMAL HIGH (ref 0.61–1.24)
GFR, Estimated: 60 mL/min — ABNORMAL LOW (ref >60)
Glucose, Bld: 113 mg/dL — ABNORMAL HIGH (ref 70–99)
Potassium: 3.7 mmol/L (ref 3.5–5.1)
Sodium: 141 mmol/L (ref 135–145)
Total Bilirubin: 0.6 mg/dL (ref 0.3–1.2)
Total Protein: 7.5 g/dL (ref 6.5–8.1)

## 2022-06-27 LAB — CBC WITH DIFFERENTIAL (CANCER CENTER ONLY)
Abs Immature Granulocytes: 0.02 10*3/uL (ref 0.00–0.07)
Basophils Absolute: 0.1 10*3/uL (ref 0.0–0.1)
Basophils Relative: 1 %
Eosinophils Absolute: 0.5 10*3/uL (ref 0.0–0.5)
Eosinophils Relative: 7 %
HCT: 43.1 % (ref 39.0–52.0)
Hemoglobin: 14.5 g/dL (ref 13.0–17.0)
Immature Granulocytes: 0 %
Lymphocytes Relative: 32 %
Lymphs Abs: 2.4 10*3/uL (ref 0.7–4.0)
MCH: 33.3 pg (ref 26.0–34.0)
MCHC: 33.6 g/dL (ref 30.0–36.0)
MCV: 98.9 fL (ref 80.0–100.0)
Monocytes Absolute: 0.7 10*3/uL (ref 0.1–1.0)
Monocytes Relative: 9 %
Neutro Abs: 3.8 10*3/uL (ref 1.7–7.7)
Neutrophils Relative %: 51 %
Platelet Count: 261 10*3/uL (ref 150–400)
RBC: 4.36 MIL/uL (ref 4.22–5.81)
RDW: 14.4 % (ref 11.5–15.5)
WBC Count: 7.5 10*3/uL (ref 4.0–10.5)
nRBC: 0 % (ref 0.0–0.2)

## 2022-06-27 LAB — CORTISOL: Cortisol, Plasma: 0.4 ug/dL

## 2022-06-27 LAB — TSH: TSH: 1.116 u[IU]/mL (ref 0.350–4.500)

## 2022-06-27 LAB — T4, FREE: Free T4: 0.8 ng/dL (ref 0.61–1.12)

## 2022-06-27 MED ORDER — SODIUM CHLORIDE 0.9 % IV SOLN
480.0000 mg | Freq: Once | INTRAVENOUS | Status: AC
  Administered 2022-06-27: 480 mg via INTRAVENOUS
  Filled 2022-06-27: qty 48

## 2022-06-27 MED ORDER — SODIUM CHLORIDE 0.9 % IV SOLN: Freq: Once | INTRAVENOUS | Status: AC

## 2022-06-27 NOTE — Telephone Encounter (Signed)
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 cortisol level early next week, if low we will begin hydrocortisone replacement

## 2022-06-27 NOTE — Telephone Encounter (Signed)
Pt and wife verbalized understanding. Will come in Monday for a repeat lab test.

## 2022-06-27 NOTE — Progress Notes (Signed)
Patient seen by Dr. Benay Spice today  Vitals are within treatment parameters. MD aware of elevation in BP--already on Lisinopril  Labs reviewed by Dr. Benay Spice and are within treatment parameters.  Per physician team, patient is ready for treatment and there are NO modifications to the treatment plan.

## 2022-06-27 NOTE — Progress Notes (Signed)
[place] OFFICE PROGRESS NOTE   Diagnosis: Melanoma  INTERVAL HISTORY:   [name] returns for a scheduled visit.  He feels well.  No rash or diarrhea.  No new complaint.  He plans to leave for [place] at the end of this month.  He will remain in [place] until April of next year.  Objective:  Vital signs in last 24 hours:  Blood pressure (!) 140/99, pulse 85, temperature 98.2 F (36.8 C), temperature source Oral, resp. rate 18, height 5' 6" (1.676 m), weight 190 lb (86.2 kg), SpO2 98 %.    Lymphatics: No cervical, supraclavicular, axillary, or inguinal nodes Resp: Decreased breath sounds with end inspiratory coarse rhonchi at the lower posterior chest bilaterally, no respiratory distress Cardio: Regular rate and rhythm GI: No hepatosplenomegaly Vascular: No leg edema  Skin: Left arm scar without evidence of recurrent tumor, purpleish soft mobile 1 cm nodule at the right forearm    Lab Results:  Lab Results  Component Value Date   WBC 7.5 06/27/2022   HGB 14.5 06/27/2022   HCT 43.1 06/27/2022   MCV 98.9 06/27/2022   PLT 261 06/27/2022   NEUTROABS 3.8 06/27/2022    CMP  Lab Results  Component Value Date   NA 136 05/02/2022   K 4.0 05/02/2022   CL 103 05/02/2022   CO2 23 05/02/2022   GLUCOSE 103 (H) 05/02/2022   BUN 21 05/02/2022   CREATININE 1.22 05/02/2022   CALCIUM 9.3 05/02/2022   PROT 7.2 05/02/2022   ALBUMIN 3.5 05/02/2022   AST 52 (H) 05/02/2022   ALT 63 (H) 05/02/2022   ALKPHOS 131 (H) 05/02/2022   BILITOT 0.8 05/02/2022   GFRNONAA >60 05/02/2022    Medications: I have reviewed the patient's current medications.   Assessment/Plan: Stage IIIC melanoma (pT4apN1a), status post a radical excision of the left forearm melanoma, left axillary sentinel lymph node mapping/biopsy, and full thickness skin graft at the resection site 01/31/2021 Biopsy of left arm melanoma 12/28/2020-clinical stage IIb ([id number]), ulceration present,  mitotic rate of 2/mm2 CTs chest, abdomen, and pelvis 01/31/2021-no evidence of metastatic disease 01/31/2021- melanoma reexcision: Breslow depth 4.24 mm, Clark level V, 6 mitoses per millimeter squared, ulceration absent,pT4a, 1/6 lymph nodes with a 5 mm deposit of metastatic melanoma,pN1a, BRAF mutation not detected Cycle 1 pembrolizumab 03/27/2021 Cycle 2 pembrolizumab 04/17/2021 Cycle 3 pembrolizumab 05/08/2021 Cycle 4 Pembrolizumab 05/29/2021 Cycle 5 pembrolizumab 06/17/2021 Cycle 6 pembrolizumab 07/08/2021 (changed to 6-week dosing) Last dose pembrolizumab January 2023 while living in [place] Restaging CTs 12/09/2021-new lung nodules 12/25/2021-CT chest super D-numerous smooth round bilateral pulmonary nodules consistent with metastatic disease, new from September 2022 Navigate bronchoscopy 12/25/2021-biopsy of right lower lobe nodule-melanoma, right lower lobe FNA-malignant cells, right lower lobe bronchial lavage-malignant cells, 11 R lymph node FNA-negative for malignant cells Brain MRI 01/09/2022-negative for metastatic disease PET 01/20/2022-low-level SUV activity associated with small lung nodules, no other evidence of metastatic disease Cycle 1 ipilimumab/nivolumab 01/29/2022 Cycle 2 ipilimumab/nivolumab 02/19/2022 CT chest 03/21/2022-tiny right lower lobe nodule, negative for pneumonia, mild bronchial wall thickening, comparison to 12/25/2021 images revealed marked improvement in right lung nodules consistent with a response to therapy Cycle 3 ipilimumab/nivolumab 03/25/2022 Cycle 4 ipilimumab/nivolumab 04/17/2022 Cycle 1 nivolumab-monthly dosing 05/02/2022 CT chest at [facility] healthcare 05/08/2022-no acute finding, no definite evidence of metastatic disease, basilar nodules are not present or obscured by atelectasis Cycle 2 nivolumab-monthly dosing 06/27/2022  History of hepatitis B Hypertension Right hip osteoarthritis 5.   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   Right hip replacement surgery  05/02/2021 7.   Left lower leg swelling 06/17/2021-negative Doppler 06/17/2021 8.   Melanoma in situ left back-February 2023 9.   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  Admission to [facility] healthcare 05/07/2022 - 05/10/2022 with diarrhea and dehydration         Disposition: [name] appears well.  The etiology of the admission with diarrhea and dehydration in June is unclear.  I doubt this was related to acute toxicity from nivolumab as his symptoms occurred within a few days of treatment and resolved over several days.  We will check a TSH and cortisol level today.   He agrees to proceed with another treatment with nivolumab today.  He will call for diarrhea following this cycle.  He plans to relocate to [place] at the end of this month.  He will schedule an appointment with his oncologist in [place] for the first week of September with a plan to continue monthly nivolumab.  I recommend a restaging chest CT within the next 2-3 months.  [name] plans to remain in [place] until late April 2024.  He will be scheduled for an office visit here in early May 2024.  I am available to see him in the interim as needed.    Betsy Coder, MD  06/27/2022  10:19 AM

## 2022-06-27 NOTE — Patient Instructions (Signed)
[facility]   Discharge Instructions: Thank you for choosing [facility] to provide your oncology and hematology care.   If you have a lab appointment with the [facility], please go directly to the [facility] and check in at the registration area.   Wear comfortable clothing and clothing appropriate for easy access to any Portacath or PICC line.   We strive to give you quality time with your provider. You may need to reschedule your appointment if you arrive late (15 or more minutes).  Arriving late affects you and other patients whose appointments are after yours.  Also, if you miss three or more appointments without notifying the office, you may be dismissed from the clinic at the provider's discretion.      For prescription refill requests, have your pharmacy contact our office and allow 72 hours for refills to be completed.    Today you received the following chemotherapy and/or immunotherapy agents Opdivo      To help prevent nausea and vomiting after your treatment, we encourage you to take your nausea medication as directed.  BELOW ARE SYMPTOMS THAT SHOULD BE REPORTED IMMEDIATELY: *FEVER GREATER THAN 100.4 F (38 C) OR HIGHER *CHILLS OR SWEATING *NAUSEA AND VOMITING THAT IS NOT CONTROLLED WITH YOUR NAUSEA MEDICATION *UNUSUAL SHORTNESS OF BREATH *UNUSUAL BRUISING OR BLEEDING *URINARY PROBLEMS (pain or burning when urinating, or frequent urination) *BOWEL PROBLEMS (unusual diarrhea, constipation, pain near the anus) TENDERNESS IN MOUTH AND THROAT WITH OR WITHOUT PRESENCE OF ULCERS (sore throat, sores in mouth, or a toothache) UNUSUAL RASH, SWELLING OR PAIN  UNUSUAL VAGINAL DISCHARGE OR ITCHING   Items with * indicate a potential emergency and should be followed up as soon as possible or go to the Emergency Department if any problems should occur.  Please show the CHEMOTHERAPY ALERT CARD or IMMUNOTHERAPY ALERT CARD at check-in to the  Emergency Department and triage nurse.  Should you have questions after your visit or need to cancel or reschedule your appointment, please contact [facility]  Dept: [phone]  and follow the prompts.  Office hours are 8:00 a.m. to 4:30 p.m. Monday - Friday. Please note that voicemails left after 4:00 p.m. may not be returned until the following business day.  We are closed weekends and major holidays. You have access to a nurse at all times for urgent questions. Please call the main number to the clinic Dept: [phone] and follow the prompts.   For any non-urgent questions, you may also contact your provider using MyChart. We now offer e-Visits for anyone 70 and older to request care online for non-urgent symptoms. For details visit [email or web address].   Also download the MyChart app! Go to the app store, search "MyChart", open the app, select [facility], and log in with your MyChart username and password.  Due to Covid, a mask is required upon entering the hospital/clinic. If you do not have a mask, one will be given to you upon arrival. For doctor visits, patients may have 1 support person aged 79 or older with them. For treatment visits, patients cannot have anyone with them due to current Covid guidelines and our immunocompromised population.   Nivolumab injection What is this medication? NIVOLUMAB (nye VOL ue mab) is a monoclonal antibody. It treats certain types of cancer. Some of the cancers treated are colon cancer, head and neck cancer, Hodgkin lymphoma, lung cancer, and melanoma. This medicine may be used for other purposes; ask  your health care provider or pharmacist if you have questions. COMMON BRAND NAME(S): Opdivo What should I tell my care team before I take this medication? They need to know if you have any of these conditions: Autoimmune diseases such as Crohn's disease, ulcerative colitis, or lupus Have had or planning to have an  allogeneic stem cell transplant (uses someone else's stem cells) History of chest radiation Organ transplant Nervous system problems such as myasthenia gravis or Guillain-Barre syndrome An unusual or allergic reaction to nivolumab, other medicines, foods, dyes, or preservatives Pregnant or trying to get pregnant Breast-feeding How should I use this medication? This medication is injected into a vein. It is given in a hospital or clinic setting. A special MedGuide will be given to you before each treatment. Be sure to read this information carefully each time. Talk to your care team regarding the use of this medication in children. While it may be prescribed for children as young as 12 years for selected conditions, precautions do apply. Overdosage: If you think you have taken too much of this medicine contact a poison control center or emergency room at once. NOTE: This medicine is only for you. Do not share this medicine with others. What if I miss a dose? Keep appointments for follow-up doses. It is important not to miss your dose. Call your care team if you are unable to keep an appointment. What may interact with this medication? Interactions have not been studied. This list may not describe all possible interactions. Give your health care provider a list of all the medicines, herbs, non-prescription drugs, or dietary supplements you use. Also tell them if you smoke, drink alcohol, or use illegal drugs. Some items may interact with your medicine. What should I watch for while using this medication? Your condition will be monitored carefully while you are receiving this medication. You may need blood work done while you are taking this medication. Do not become pregnant while taking this medication or for 5 months after stopping it. Women should inform their care team if they wish to become pregnant or think they might be pregnant. There is a potential for serious harm to an unborn child.  Talk to your care team for more information. Do not breast-feed an infant while taking this medication or for 5 months after stopping it. What side effects may I notice from receiving this medication? Side effects that you should report to your care team as soon as possible: Allergic reactions--skin rash, itching, hives, swelling of the face, lips, tongue, or throat Bloody or black, tar-like stools Change in vision Chest pain Diarrhea Dry cough, shortness of breath or trouble breathing Eye pain Fast or irregular heartbeat Fever, chills High blood sugar (hyperglycemia)--increased thirst or amount of urine, unusual weakness or fatigue, blurry vision High thyroid levels (hyperthyroidism)--fast or irregular heartbeat, weight loss, excessive sweating or sensitivity to heat, tremors or shaking, anxiety, nervousness, irregular menstrual cycle or spotting Kidney injury--decrease in the amount of urine, swelling of the ankles, hands, or feet Liver injury--right upper belly pain, loss of appetite, nausea, light-colored stool, dark yellow or brown urine, yellowing skin or eyes, unusual weakness or fatigue Low red blood cell count--unusual weakness or fatigue, dizziness, headache, trouble breathing Low thyroid levels (hypothyroidism)--unusual weakness or fatigue, increased sensitivity to cold, constipation, hair loss, dry skin, weight gain, feelings of depression Mood and behavior changes-confusion, change in sex drive or performance, irritability Muscle pain or cramps Pain, tingling, or numbness in the hands or feet, muscle weakness,   trouble walking, loss of balance or coordination Red or dark brown urine Redness, blistering, peeling, or loosening of the skin, including inside the mouth Stomach pain Unusual bruising or bleeding Side effects that usually do not require medical attention (report to your care team if they continue or are bothersome): Bone pain Constipation Loss of  appetite Nausea Tiredness Vomiting This list may not describe all possible side effects. Call your doctor for medical advice about side effects. You may report side effects to FDA at 1-800-FDA-1088. Where should I keep my medication? This medication is given in a hospital or clinic and will not be stored at home. NOTE: This sheet is a summary. It may not cover all possible information. If you have questions about this medicine, talk to your doctor, pharmacist, or health care provider.  2023 Elsevier/Gold Standard (2021-10-11 00:00:00)

## 2022-06-28 ENCOUNTER — Other Ambulatory Visit: Payer: Self-pay

## 2022-06-29 LAB — T4: T4, Total: 5.2 ug/dL (ref 4.5–12.0)

## 2022-06-30 ENCOUNTER — Inpatient Hospital Stay: Payer: Medicare Other

## 2022-06-30 ENCOUNTER — Other Ambulatory Visit: Payer: Self-pay

## 2022-06-30 ENCOUNTER — Telehealth: Payer: Self-pay

## 2022-06-30 DIAGNOSIS — C4362 Malignant melanoma of left upper limb, including shoulder: Secondary | ICD-10-CM

## 2022-06-30 DIAGNOSIS — Z5112 Encounter for antineoplastic immunotherapy: Secondary | ICD-10-CM | POA: Diagnosis not present

## 2022-06-30 LAB — CORTISOL: Cortisol, Plasma: 3.5 ug/dL

## 2022-06-30 NOTE — Telephone Encounter (Signed)
Labs added from today's visit per MD Benay Spice.   Spoke with pt's wife relaying message below. Pt's wife verbalizes understanding of 2 upcoming Prednisone doses. Pt's wife verbalizes understanding of s/s to call and report. Pt's wife states that they will consider going to an endocrinologist and will call MD Sherrill's office with their decision.

## 2022-06-30 NOTE — Telephone Encounter (Signed)
-----   Message from Ladell Pier, MD sent at 06/30/2022  2:02 PM EDT ----- Please call patient, cortisol level remains low, start hydrocortisone 20 mg a.m., 10 mg p.m., discontinue prednisone  Add testosterone level and LH level to lab from today  He needs to see an endocrinologist, we can arrange for an appointment here if he will agree  Call for weakness, dizziness, lack of energy

## 2022-07-01 ENCOUNTER — Other Ambulatory Visit: Payer: Self-pay

## 2022-07-01 ENCOUNTER — Telehealth: Payer: Self-pay

## 2022-07-01 ENCOUNTER — Encounter: Payer: Self-pay | Admitting: Oncology

## 2022-07-01 MED ORDER — HYDROCORTISONE 20 MG PO TABS: ORAL_TABLET | ORAL | 0 refills | Status: DC

## 2022-07-01 NOTE — Telephone Encounter (Signed)
Return call to Pt per Dr Benay Spice to inquire about PT seeing an endocrinologist. Spoke with Pt's wife who informed me that they will be returning to [place] for the winter and will establish care there. Also went over instructions about stopping the prednisone and starting hydrocortisone 20 mg in the am and 10 mg in the pm. Inquired if Dr Benay Spice wants lab work before they leave informed her once we get additional lab results I will inquire about labs before they leave for [place]. Pt and Pt's wife verbalized understanding.

## 2022-07-02 LAB — ACTH: C206 ACTH: 3.3 pg/mL — ABNORMAL LOW (ref 7.2–63.3)

## 2022-07-03 ENCOUNTER — Ambulatory Visit: Payer: Medicare Other

## 2022-07-03 ENCOUNTER — Other Ambulatory Visit: Payer: Medicare Other

## 2022-07-03 ENCOUNTER — Telehealth: Payer: Self-pay

## 2022-07-03 ENCOUNTER — Other Ambulatory Visit: Payer: Self-pay

## 2022-07-03 ENCOUNTER — Ambulatory Visit: Payer: Medicare Other | Admitting: Oncology

## 2022-07-03 DIAGNOSIS — C4362 Malignant melanoma of left upper limb, including shoulder: Secondary | ICD-10-CM

## 2022-07-03 LAB — TESTOSTERONE: Testosterone: 375 ng/dL (ref 264–916)

## 2022-07-03 LAB — LUTEINIZING HORMONE: LH: 6.7 m[IU]/mL (ref 1.7–8.6)

## 2022-07-03 NOTE — Telephone Encounter (Signed)
TC to Pt spoke with Pt's wife, relayed message. Pt's wife requested recommendation of endocrinologist. Informed her I will work on it. Discussed with Dr Benay Spice. Call placed to Dr Layla Maw who is on vacation and Dr Delrae Rend referral placed.

## 2022-07-03 NOTE — Telephone Encounter (Signed)
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

## 2022-07-03 NOTE — Telephone Encounter (Signed)
Spoke with [place] from [facility] lab she stated testosterone and Luteinizing hormone are processed at [facility] and will result in about 2 days.

## 2022-07-04 ENCOUNTER — Telehealth: Payer: Self-pay

## 2022-07-04 NOTE — Telephone Encounter (Addendum)
Dr.Sherrill office called to see about getting patient in  for pituitary toxicity before the end of the month because patient will be traveling out of state. Advised that Dr. Cruzita Lederer is out of office until next week.

## 2022-07-08 ENCOUNTER — Telehealth: Payer: Self-pay

## 2022-07-08 ENCOUNTER — Telehealth: Payer: Self-pay | Admitting: "Endocrinology

## 2022-07-08 NOTE — Telephone Encounter (Signed)
[facility]  called to see if you could see this patient. They had sent the referral over for skin cancer so I denied it, they called and said that was an error on there end but are concerned with pituitary?

## 2022-07-08 NOTE — Telephone Encounter (Addendum)
TC to Dr Liliane Channel office to see if a referral was sent to the office. Per Dr Chrissie Noa office. Spoke with  [place]  from intake who stated they denied the request because the Pt's diagnosis was melanoma of the skin and they don't see Pt's with skin cancer. 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 [place] stated she would contact Dr Dorris Fetch to look over the Pt's information.

## 2022-07-08 NOTE — Telephone Encounter (Signed)
TC from Dr Chrissie Noa office stating Dr Renne Crigler was booked and could not see the Pt. But Pt was referred to Dr Dorris Fetch in [phone] Per Dr Chrissie Noa Nurse Pt was contacted and agreed to see Dr in [place].

## 2022-07-10 ENCOUNTER — Ambulatory Visit: Payer: Medicare Other | Admitting: Oncology

## 2022-07-10 ENCOUNTER — Ambulatory Visit: Payer: Medicare Other

## 2022-07-10 ENCOUNTER — Other Ambulatory Visit: Payer: Medicare Other

## 2022-07-11 ENCOUNTER — Ambulatory Visit: Payer: Medicare Other | Admitting: Oncology

## 2022-07-11 ENCOUNTER — Ambulatory Visit: Payer: Medicare Other

## 2022-07-11 ENCOUNTER — Other Ambulatory Visit: Payer: Medicare Other

## 2022-07-11 ENCOUNTER — Encounter: Payer: Self-pay | Admitting: Oncology

## 2022-07-14 ENCOUNTER — Encounter: Payer: Self-pay | Admitting: Oncology

## 2022-07-14 ENCOUNTER — Other Ambulatory Visit: Payer: Self-pay

## 2022-07-14 ENCOUNTER — Telehealth: Payer: Self-pay | Admitting: "Endocrinology

## 2022-07-14 MED ORDER — HYDROCORTISONE 20 MG PO TABS: ORAL_TABLET | ORAL | 1 refills | Status: AC

## 2022-07-14 NOTE — Telephone Encounter (Signed)
Called pt to sch for low cortisol (acth)- he said he would have his wife to call back to schedule this. I had intially closed the referral because it says DX was Skin Cancer. Dr Dorris Fetch approved to see for low cortisol- but we do not have a correct referral. I have reached out to get that corrected, if patient calls back just schedule it and notate that I am working on getting referral fixed. Thank you

## 2022-07-14 NOTE — Telephone Encounter (Signed)
Could you place the referral for [facility], Dr Dorris Fetch. We do not have a referral and we are ready to schedule the patient - thank you

## 2022-07-23 ENCOUNTER — Ambulatory Visit: Payer: Medicare Other | Admitting: "Endocrinology

## 2022-07-24 IMAGING — CT CT CHEST W/ CM
2 of 4 series · 12 of 36 positions shown, 15 images · IV contrast (APPLIED)
Comparison: January 31, 2021.
COMPARISON: January 31, 2021.

Addendum:
CLINICAL DATA: 70-year-old male presents for evaluation of cough in
the setting of metastatic melanoma. Concern also for pneumonitis.

* Tracking Code: BO *
EXAM:
CT CHEST WITH CONTRAST
TECHNIQUE: Multidetector CT imaging of the chest was performed during
intravenous contrast administration.

[Series 2: routine chest with · axial · 0.94mm/px · z∈[-323,-49]mm · 9 of 163 slices shown, 12 images]
[im 13/163  mediastinal]
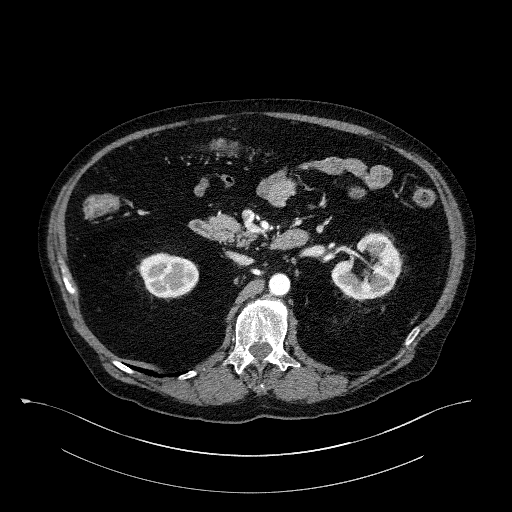
[im 13/163  lung]
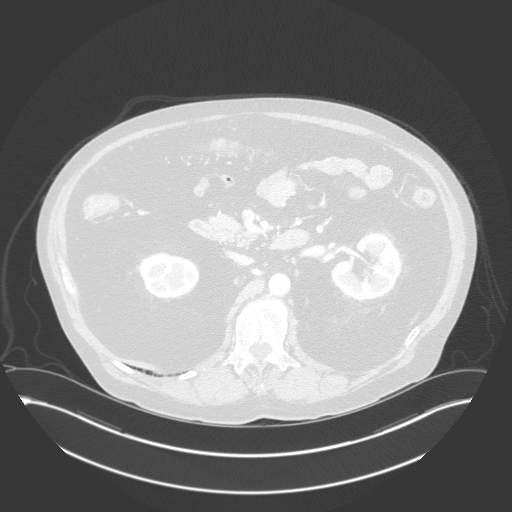
[im 38/163  lung]
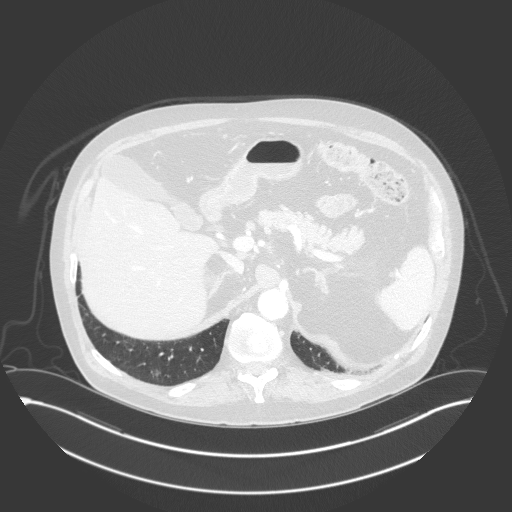
[im 50/163  lung]
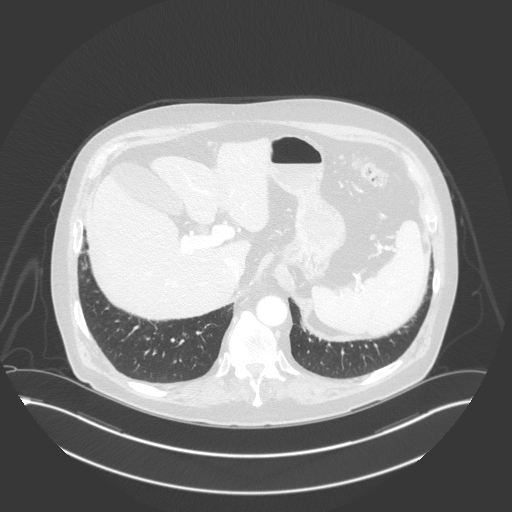
[im 63/163  lung]
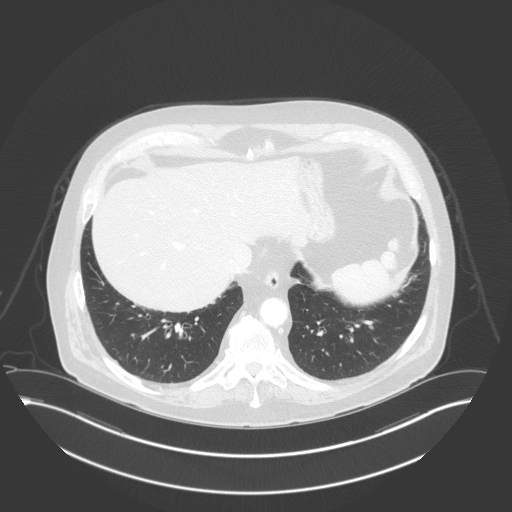
[im 88/163  mediastinal]
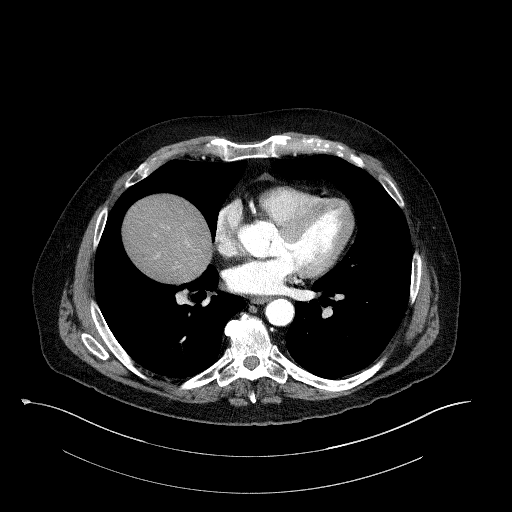
[im 88/163  lung]
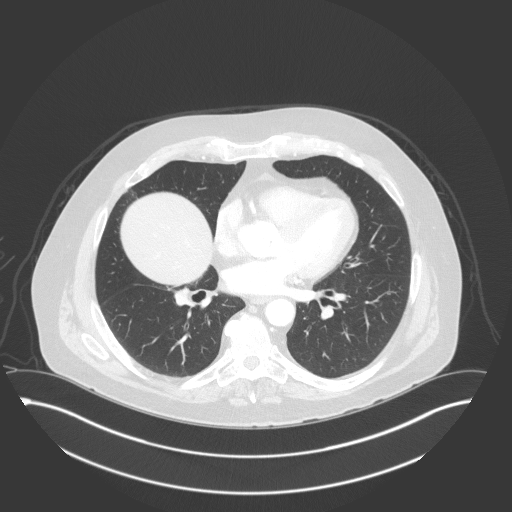
[im 100/163  lung]
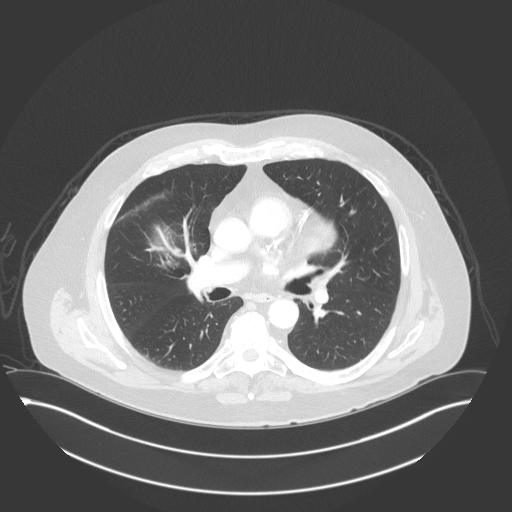
[im 113/163  lung]
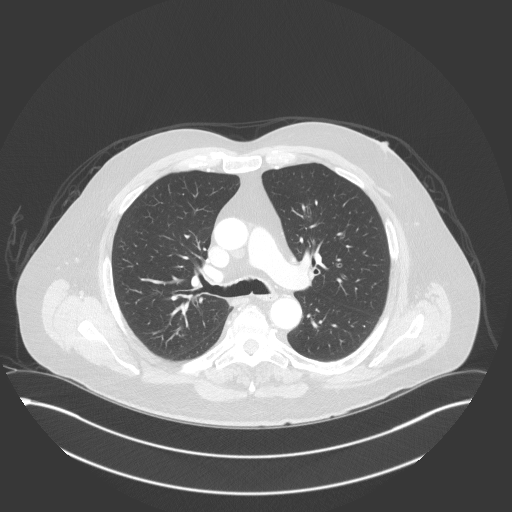
[im 138/163  lung]
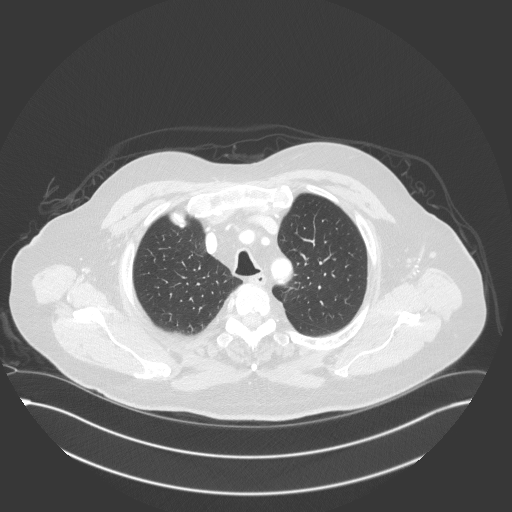
[im 150/163  mediastinal]
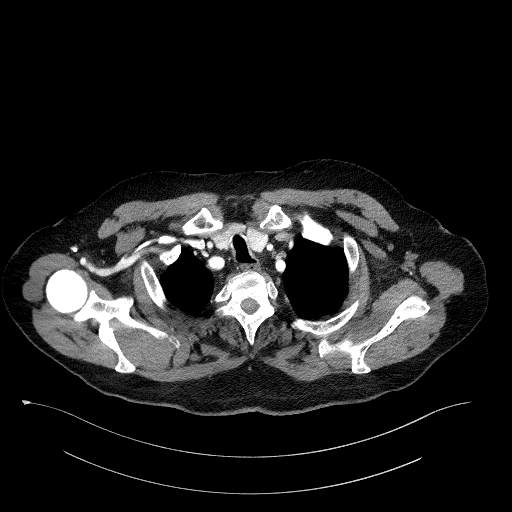
[im 150/163  lung]
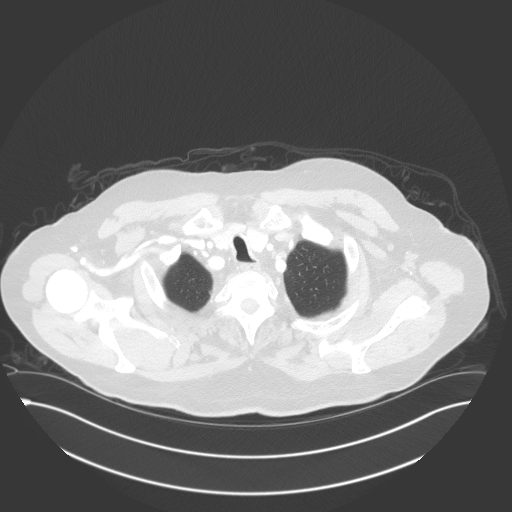

[Series 5: coronal · coronal · 0.66mm/px · 3 of 155 slices shown]
[im 31/155  lung]
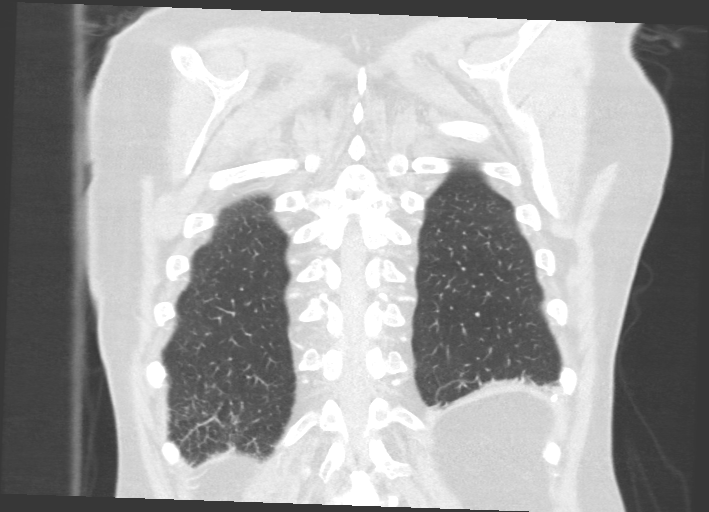
[im 62/155  lung]
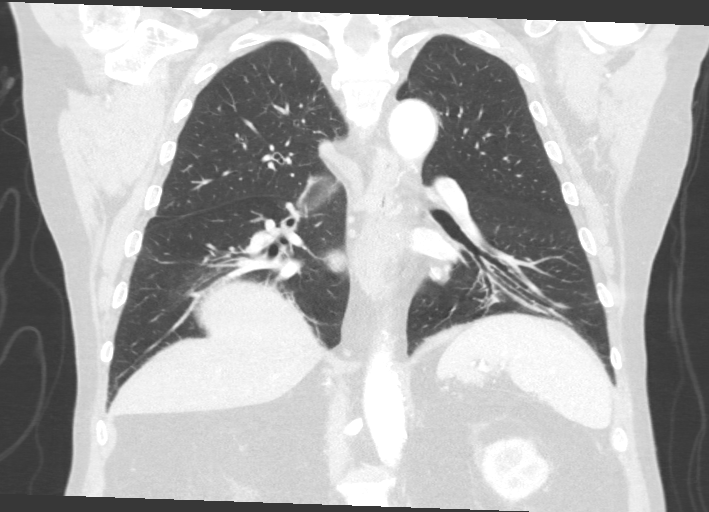
[im 93/155  lung]
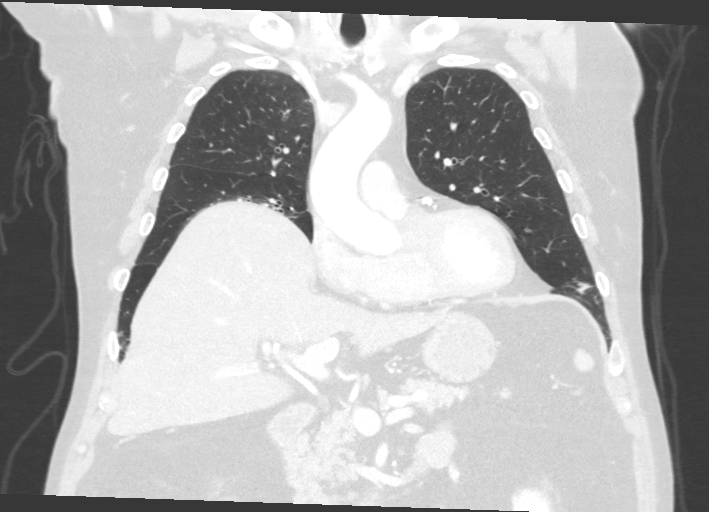

[12 of 36 positions shown; findings below may reference images not displayed]

RADIATION DOSE REDUCTION: This exam was performed according to the
departmental dose-optimization program which includes automated
exposure control, adjustment of the mA and/or kV according to
patient size and/or use of iterative reconstruction technique.

CONTRAST:  75mL OMNIPAQUE IOHEXOL 300 MG/ML  SOLN
FINDINGS: Cardiovascular: Calcified and noncalcified aortic atherosclerotic
plaque. No aneurysmal dilation. Central pulmonary vasculature
unremarkable on venous phase assessment. Heart size normal without
pericardial effusion of substantial volume. Three-vessel coronary
artery disease.

Mediastinum/Nodes: Esophagus grossly normal.

No thoracic inlet lymphadenopathy. No axillary lymphadenopathy. No
mediastinal or hilar lymphadenopathy.

Surgical clips in the LEFT axilla as before. The there are tiny
lymph nodes in the LEFT axilla largest 5 mm unchanged from prior
imaging.

Lungs/Pleura: No consolidation or pleural effusion. No areas of
patchy ground-glass or septal thickening. Mild bronchial wall
thickening centrally is diffuse. Signs of prior granulomatous
disease with small calcified granulomas in the RIGHT chest.

Tiny pulmonary nodule in the RIGHT lower lobe (image 103/4) area
obscured by atelectasis and airspace process on the prior study.

Upper Abdomen: Hepatic steatosis. No biliary duct dilation or
pericholecystic stranding. No focal, suspicious hepatic lesion.
Liver is incompletely imaged today. Normal, without mass,
inflammation or ductal dilatation. Spleen is normal. Adrenal glands
are normal. No acute upper abdominal process to the extent
evaluated.

Musculoskeletal: No acute bone finding. No destructive bone process.
Spinal degenerative changes.
IMPRESSION: 1. No signs of pneumonia.
2. Mild bronchial wall thickening centrally is diffuse, can be seen
in the setting of bronchitis.
3. No definitive signs of disease recurrence or metastasis. Tiny
pulmonary nodule in the RIGHT lower lobe, suggest attention on
follow-up.
4. Hepatic steatosis.
5. Aortic atherosclerosis and 3 vessel coronary artery disease.

Aortic Atherosclerosis (2K6JZ-BLQ.Q).

ADDENDUM:
There are prior images now available from December 25, 2021. These
are from [REDACTED].

There is marked improvement of the nodules that are seen in the
RIGHT lower lobe and RIGHT middle and upper lobes.

Tiny nodule referenced in the initial report is seen in addition to
a 3-4 mm nodule (image 89/4) and a small group of nodules adjacent
to the major fissure on images 68-79 of series 4 measuring 2-3 mm. A
2 mm nodule in the posterior RIGHT middle lobe on image 73/4
measures approximately 5 mm on the prior study. Lower lobe nodules
range from 4 mm to up to 10 mm on the previous scan. This reflects
interval response to therapy.

*** End of Addendum ***
RADIATION DOSE REDUCTION: This exam was performed according to the
departmental dose-optimization program which includes automated
exposure control, adjustment of the mA and/or kV according to
patient size and/or use of iterative reconstruction technique.

CONTRAST:  75mL OMNIPAQUE IOHEXOL 300 MG/ML  SOLN
FINDINGS: Cardiovascular: Calcified and noncalcified aortic atherosclerotic
plaque. No aneurysmal dilation. Central pulmonary vasculature
unremarkable on venous phase assessment. Heart size normal without
pericardial effusion of substantial volume. Three-vessel coronary
artery disease.

Mediastinum/Nodes: Esophagus grossly normal.

No thoracic inlet lymphadenopathy. No axillary lymphadenopathy. No
mediastinal or hilar lymphadenopathy.

Surgical clips in the LEFT axilla as before. The there are tiny
lymph nodes in the LEFT axilla largest 5 mm unchanged from prior
imaging.

Lungs/Pleura: No consolidation or pleural effusion. No areas of
patchy ground-glass or septal thickening. Mild bronchial wall
thickening centrally is diffuse. Signs of prior granulomatous
disease with small calcified granulomas in the RIGHT chest.

Tiny pulmonary nodule in the RIGHT lower lobe (image 103/4) area
obscured by atelectasis and airspace process on the prior study.

Upper Abdomen: Hepatic steatosis. No biliary duct dilation or
pericholecystic stranding. No focal, suspicious hepatic lesion.
Liver is incompletely imaged today. Normal, without mass,
inflammation or ductal dilatation. Spleen is normal. Adrenal glands
are normal. No acute upper abdominal process to the extent
evaluated.

Musculoskeletal: No acute bone finding. No destructive bone process.
Spinal degenerative changes.
IMPRESSION: 1. No signs of pneumonia.
2. Mild bronchial wall thickening centrally is diffuse, can be seen
in the setting of bronchitis.
3. No definitive signs of disease recurrence or metastasis. Tiny
pulmonary nodule in the RIGHT lower lobe, suggest attention on
follow-up.
4. Hepatic steatosis.
5. Aortic atherosclerosis and 3 vessel coronary artery disease.

Aortic Atherosclerosis (2K6JZ-BLQ.Q).

## 2022-09-03 ENCOUNTER — Encounter: Payer: Self-pay | Admitting: Oncology

## 2023-03-31 ENCOUNTER — Telehealth: Payer: Self-pay | Admitting: Oncology

## 2023-03-31 NOTE — Telephone Encounter (Signed)
Patient and wife called to cancel appointment 03/30/23

## 2023-04-02 ENCOUNTER — Inpatient Hospital Stay: Payer: Medicare Other | Admitting: Oncology

## 2024-11-06 ENCOUNTER — Other Ambulatory Visit: Payer: Self-pay
